# Patient Record
Sex: Male | Born: 1990 | Race: Black or African American | Hispanic: Yes | Smoking: Former smoker
Health system: Southern US, Community
[De-identification: ages and names within clinical notes are randomized; demographics above are authoritative.]

## PROBLEM LIST (undated history)

## (undated) DIAGNOSIS — Z21 Asymptomatic human immunodeficiency virus [HIV] infection status: Secondary | ICD-10-CM

## (undated) DIAGNOSIS — J45909 Unspecified asthma, uncomplicated: Secondary | ICD-10-CM

## (undated) DIAGNOSIS — F319 Bipolar disorder, unspecified: Secondary | ICD-10-CM

## (undated) DIAGNOSIS — F1010 Alcohol abuse, uncomplicated: Secondary | ICD-10-CM

## (undated) DIAGNOSIS — F909 Attention-deficit hyperactivity disorder, unspecified type: Secondary | ICD-10-CM

---
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
## 2009-02-04 ENCOUNTER — Emergency Department: Payer: Self-pay | Admitting: Emergency Medicine

## 2009-07-31 ENCOUNTER — Emergency Department: Payer: Self-pay | Admitting: Unknown Physician Specialty

## 2010-08-02 LAB — COMPREHENSIVE METABOLIC PANEL
AST: 23 U/L (ref 0–37)
Albumin: 3.9 g/dL (ref 3.5–5.2)
Alkaline Phosphatase: 50 U/L (ref 39–117)
BUN: 11 mg/dL (ref 6–23)
Chloride: 106 mEq/L (ref 96–112)
Creatinine, Ser: 1 mg/dL (ref 0.4–1.5)
GFR calc Af Amer: >60 mL/min (ref >60)
Potassium: 3.2 mEq/L — ABNORMAL LOW (ref 3.5–5.1)
Total Bilirubin: 0.6 mg/dL (ref 0.3–1.2)
Total Protein: 6.5 g/dL (ref 6.0–8.3)

## 2010-08-02 LAB — DIFFERENTIAL
Eosinophils Relative: 1 % (ref 0–5)
Lymphocytes Relative: 15 % (ref 12–46)
Monocytes Absolute: 0.5 10*3/uL (ref 0.1–1.0)
Monocytes Relative: 9 % (ref 3–12)
Neutro Abs: 4.4 10*3/uL (ref 1.7–7.7)

## 2010-08-02 LAB — RAPID URINE DRUG SCREEN, HOSP PERFORMED
Amphetamines: NOT DETECTED
Cocaine: NOT DETECTED
Opiates: NOT DETECTED
Tetrahydrocannabinol: NOT DETECTED

## 2010-08-02 LAB — CBC
HCT: 42.1 % (ref 39.0–52.0)
Platelets: 150 10*3/uL (ref 150–400)
RDW: 14 % (ref 11.5–15.5)
WBC: 5.9 10*3/uL (ref 4.0–10.5)

## 2010-08-02 LAB — ETHANOL: Alcohol, Ethyl (B): 173 mg/dL — ABNORMAL HIGH (ref 0–10)

## 2010-08-02 LAB — TRICYCLICS SCREEN, URINE: TCA Scrn: NOT DETECTED

## 2010-08-31 NOTE — Op Note (Signed)
NAMEESTEVAN, KERSH               ACCOUNT NO.:  [id number]   MEDICAL RECORD NO.:  [id number]          PATIENT TYPE:  AMB   LOCATION:  DSC                          FACILITY:  [facility]   PHYSICIAN:  Matthew A. 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   PROCEDURE:  Division of the left index to left long cross finger flap  and manipulation of thumb MP and index finger MP and PIP joints.   SURGEON:  Artist Pais. Mina Marble, M.D.   ASSISTANT:  None.   ANESTHESIA:  General. No tourniquet, no complications, and no drain.   DESCRIPTION OF PROCEDURE:  The patient was taken to operating suite  after induction of adequate general anesthesia, left upper extremity was  prepped in sterile fashion.  Once this was done the previously placed  sutures along the left thumb left index finger cross-finger flap complex  were removed. After this was done, the flap was divided.  The flap was  then inset both onto the thumb index finger using 4-0 chromic. The MP  joint of the thumb and the MP and PIP joint index finger were gently  manipulated under anesthesia to full passive flexion/extension. The  wounds were dressed with Xeroform, 4x4s and Coban wrap.  The patient  tolerated procedures well and went to recovery room in stable condition.      [name], M.D.  Electronically Signed     MAW/MEDQ  D:  09/13/2006  T:  09/13/2006  Job:  [id number]

## 2010-08-31 NOTE — Op Note (Signed)
Daniel [name], Daniel [name]               ACCOUNT NO.:  [id number]   MEDICAL RECORD NO.:  [id number]          PATIENT TYPE:  INP   LOCATION:  5037                         FACILITY:  [facility]   PHYSICIAN:  Artist Pais. Weingold, M.D.DATE OF BIRTH:  Sep 11, 1990   DATE OF PROCEDURE:  08/28/2006  DATE OF DISCHARGE:                               OPERATIVE REPORT   PREOPERATIVE DIAGNOSIS:  Tip amputation nondominant left thumb.   POSTOPERATIVE DIAGNOSIS:  Tip amputation nondominant left thumb.   PROCEDURE:  Irrigation and debridement and left index to left thumb  cross finger flap.   SURGEON:  Dr. Mina Marble   ASSISTANT:  None.   ANESTHESIA:  General.   TOURNIQUET TIME:  1 hour.   COMPLICATIONS:  None.   OPERATIVE REPORT:  The patient was taken to the operating suite.  After  the induction of adequate general anesthesia, the left upper extremity  was prepped in a sterile fashion.  An Esmarch was used to exsanguinate  limb.  Tourniquet was then inflated to 250 mmHg.  At this point in time,  the left thumb was inspected.  There was complete loss of the nail  plate.  The nail matrix was completely gone except the very radial  aspect of the germinal matrix, and there was a comminuted fracture of  the remaining aspect of the distal phalanx.  The wound was thoroughly  irrigated.  The distal phalangeal bone was debrided down to a stable  rim.  At this point in time, a cross finger flap was fashioned from the  index finger on the same hand, and flap was raised with a rearly-based  hinge.  The cross finger flap was then sewn to the volar aspect of the  thumb under no undue tension to cover the soft tissue and osseous  defect.  This was done with 4-0 nylon.  After this is completed, a full-  thickness hemi-graft was taken from the forearm and wrist crease.  This  full-thickness hemi-graft was then sewn into the donor site using 4-0  nylon on the corners and 4-0 chromic, running, around the edges.  The  4-  0 nylon was then used to tie over a well-padded bolster to secure the  graft.  The donor site was then closed with 4-0 Vicryl Rapide suture in  horizontal mattress.  The patient was then placed in a sterile dressing  of Xeroform, 4 x 4's fluffs with the wrist flexed, the thumb and index  finger protected.  Prior to wound closure, a median nerve block was  performed for postoperative pain control.  The patient tolerated the  procedures well.      [name], M.D.  Electronically Signed    MAW/MEDQ  D:  08/28/2006  T:  08/29/2006  Job:  [id number]

## 2010-08-31 NOTE — Consult Note (Signed)
Daniel [name]               ACCOUNT NO.:  [id number]   MEDICAL RECORD NO.:  [id number]          PATIENT TYPE:  INP   LOCATION:  1831                         FACILITY:  [facility]   PHYSICIAN:  Artist Pais. Weingold, M.D.DATE OF BIRTH:  [birth year]   DATE OF CONSULTATION:  DATE OF DISCHARGE:                                 CONSULTATION   PHYSICIAN REQUESTING CONSULTATION:  Dr. Estell Harpin.   REASON FOR CONSULTATION:  Daniel [name] is a 20 year old right-hand  dominant male who is currently at the [facility]  who presents with traumatic injury to his nondominant left thumb with an  amputation of the distal aspect.  He is 15.   ALLERGIES:  HE HAS NO KNOWN DRUG ALLERGIES.   CURRENT MEDICATIONS:  He takes an inhaler on a p.r.n. basis for asthma.   PAST MEDICAL HISTORY:  No other past medical or surgical history of  note.   SOCIAL HISTORY:  He denies excessive alcohol or tobacco use.   FAMILY MEDICAL HISTORY:  Noncontributory according to the patient.   PHYSICAL EXAMINATION:  EXTREMITIES:  Reveals a crush evulsion type  injury to the distal aspect of his nondominant left thumb with loss of  the volar skin from the IP flexion crease distal.  Loss of the nail  plate and a significant avulsion-type injury with ribbon defects.  Ribbon signs of the neurovascular bundles on both sides with significant  evulsion component.  The pad is also quite significantly crushed.   X-rays show no fracture, but exposed distal phalangeal bone.   IMPRESSION:  A 20 year old male with a significant crush evulsion injury  to the nondominant left thumb, which is not replantable.  At this point  in time, we are going to take the patient to the operating room for  irrigation, debridement and possible cross finger flap for his primary  closure.      [name], M.D.  Electronically Signed     MAW/MEDQ  D:  08/28/2006  T:  08/28/2006  Job:  [id number]

## 2010-09-03 NOTE — Discharge Summary (Signed)
NAMECALDER, OBLINGER NO.:  [id number]   MEDICAL RECORD NO.:  [id number]          PATIENT TYPE:  IPS   LOCATION:  0507                          [facility]   PHYSICIAN:  Geoffery Lyons, M.D.      DATE OF BIRTH:  01/05/1991   DATE OF ADMISSION:  05/07/2008  DATE OF DISCHARGE:  05/09/2008                               DISCHARGE SUMMARY   CHIEF COMPLAINT/PRESENT ILLNESS:  This was the first admission to [facility] for this 20 year old male, admitted on an  involuntary basis.  Endorsed that he has been drinking super hard  about every 2 months.  He drank four glasses of heavy liquor and one can  of __________.  The commitment papers endorsed that he ingested wine and  pills in a suicide attempt.  He denies suicidal ideation, but did  endorse blackouts.   PAST PSYCHIATRIC HISTORY:  The first time at [facility].  In the  past, has been on lithium, Seroquel and Ritalin.  Diagnosed with bipolar  and ADHD, but endorsed that medications he felt did not work.   ALCOHOL AND DRUG HISTORY:  Persistent use of alcohol in binging pattern.   MEDICAL HISTORY:  Noncontributory.   MEDICATIONS:  None prescribed.   Physical exam failed to show any acute findings.   LABORATORY WORKUP:  Results not available in the chart.   MENTAL STATUS EXAM:  Reveals an alert cooperative male.  Mood anxious.  Affect anxious.  Does not want to be in the hospital.  Thought process  is logical, coherent and relevant.  There were no active suicidal or  homicidal ideas.  No hallucinations or delusions.  He minimizes his use  of alcohol.  He also minimizes any of his presenting symptomatology.  Endorsed no active suicidal or homicidal ideas.  No evidence of  delusions or hallucinations.  Cognition well-preserved.   ADMITTING DIAGNOSES:  Axis I:  Alcohol abuse, status post acute alcohol  intoxication.  Mood disorder, not otherwise specified.  Attention  deficit hyperactivity  disorder.  Axis II:  No diagnosis.  Axis III:  No diagnosis.  Axis  IV: Moderate.  Axis V:  Global Assessment of Functioning upon admission 35, highest  Global Assessment of Functioning in the last year 60.   COURSE IN THE HOSPITAL:  He was admitted, started individual and group  psychotherapy.  As already stated, he report drinking heavily.  He lives  with his grandmother.  He was recently released from jail for common law  robbery.  He endorsed he is in a gang, but not involved in a any current  gang activity.  He endorsed that his gang his is his family and legal  problems he has encountered are not gang related.  Grandmother was  contacted.  She endorsed that he has refused to take medications for  bipolar.  She said that he would be receptive to take medication if he  was to find something that would be effective without side effects.  He  is presently working to try to get his GED.  He was able  to open up.  He  did some work in terms of issues on his growing up.  He was receptive to  the idea that it would be in his best interest to abstain from drinking.  He was going to consider taking medications, but requested not to be at  this particular time.  On May 09, 2008, he was in full contact with  reality.  No active suicidal or homicidal ideas, no delusions.  Grandmother had no concern about his safety.  He was discharged to  outpatient follow-up.   DISCHARGE DIAGNOSES:  Axis I:  Mood disorder, not otherwise specified,  alcohol abuse, attention deficit hyperactivity disorder.  Axis II:  No diagnosis.  Axis III:  No diagnosis.  Axis IV:  Moderate.  Axis V:  Global Assessment of Functioning on discharge 55-60.   Discharged on no medications.  Follow-up by [facility], Dr. Lang Snow  and [facility].      Geoffery Lyons, M.D.  Electronically Signed     IL/MEDQ  D:  05/22/2008  T:  05/23/2008  Job:  [id number]

## 2014-05-14 ENCOUNTER — Emergency Department (HOSPITAL_COMMUNITY)
Admission: EM | Admit: 2014-05-14 | Discharge: 2014-05-14 | Disposition: A | Discharge status: Home / Self Care | Payer: Self-pay | Attending: Emergency Medicine | Admitting: Emergency Medicine

## 2014-05-14 ENCOUNTER — Encounter (HOSPITAL_COMMUNITY): Payer: Self-pay | Admitting: Emergency Medicine

## 2014-05-14 DIAGNOSIS — R197 Diarrhea, unspecified: Secondary | ICD-10-CM | POA: Insufficient documentation

## 2014-05-14 DIAGNOSIS — R1084 Generalized abdominal pain: Secondary | ICD-10-CM | POA: Insufficient documentation

## 2014-05-14 DIAGNOSIS — R112 Nausea with vomiting, unspecified: Secondary | ICD-10-CM | POA: Insufficient documentation

## 2014-05-14 DIAGNOSIS — Z8659 Personal history of other mental and behavioral disorders: Secondary | ICD-10-CM | POA: Insufficient documentation

## 2014-05-14 HISTORY — DX: Bipolar disorder, unspecified: F31.9

## 2014-05-14 HISTORY — DX: Alcohol abuse, uncomplicated: F10.10

## 2014-05-14 HISTORY — DX: Attention-deficit hyperactivity disorder, unspecified type: F90.9

## 2014-05-14 LAB — COMPREHENSIVE METABOLIC PANEL
ALBUMIN: 4.4 g/dL (ref 3.5–5.2)
ALK PHOS: 37 U/L — AB (ref 39–117)
ALT: 17 U/L (ref 0–53)
ANION GAP: 7 (ref 5–15)
AST: 23 U/L (ref 0–37)
BUN: 11 mg/dL (ref 6–23)
CALCIUM: 9 mg/dL (ref 8.4–10.5)
CO2: 24 mmol/L (ref 19–32)
CREATININE: 0.87 mg/dL (ref 0.50–1.35)
Chloride: 104 mmol/L (ref 96–112)
GFR calc Af Amer: >90 mL/min (ref >90)
GFR calc non Af Amer: >90 mL/min (ref >90)
Glucose, Bld: 115 mg/dL — ABNORMAL HIGH (ref 70–99)
Potassium: 4.2 mmol/L (ref 3.5–5.1)
Sodium: 135 mmol/L (ref 135–145)
TOTAL PROTEIN: 8.1 g/dL (ref 6.0–8.3)
Total Bilirubin: 1.1 mg/dL (ref 0.3–1.2)

## 2014-05-14 LAB — CBC WITH DIFFERENTIAL/PLATELET
BASOS PCT: 0 % (ref 0–1)
Basophils Absolute: 0 10*3/uL (ref 0.0–0.1)
Eosinophils Absolute: 0 10*3/uL (ref 0.0–0.7)
Eosinophils Relative: 0 % (ref 0–5)
HEMATOCRIT: 46 % (ref 39.0–52.0)
Hemoglobin: 14.7 g/dL (ref 13.0–17.0)
LYMPHS ABS: 0.3 10*3/uL — AB (ref 0.7–4.0)
Lymphocytes Relative: 5 % — ABNORMAL LOW (ref 12–46)
MCH: 27.1 pg (ref 26.0–34.0)
MCHC: 32 g/dL (ref 30.0–36.0)
MCV: 84.7 fL (ref 78.0–100.0)
MONO ABS: 0.2 10*3/uL (ref 0.1–1.0)
Monocytes Relative: 3 % (ref 3–12)
NEUTROS PCT: 92 % — AB (ref 43–77)
Neutro Abs: 7 10*3/uL (ref 1.7–7.7)
PLATELETS: 186 10*3/uL (ref 150–400)
RBC: 5.43 MIL/uL (ref 4.22–5.81)
RDW: 13.7 % (ref 11.5–15.5)
WBC: 7.6 10*3/uL (ref 4.0–10.5)

## 2014-05-14 LAB — LIPASE, BLOOD: Lipase: 28 U/L (ref 11–59)

## 2014-05-14 MED ORDER — ONDANSETRON HCL 4 MG PO TABS: 4.0000 mg | ORAL_TABLET | Freq: Three times a day (TID) | ORAL | Status: DC | PRN

## 2014-05-14 MED ORDER — DICYCLOMINE HCL 10 MG/ML IM SOLN
20.0000 mg | Freq: Once | INTRAMUSCULAR | Status: AC
  Administered 2014-05-14: 20 mg via INTRAMUSCULAR
  Filled 2014-05-14: qty 2

## 2014-05-14 MED ORDER — DICYCLOMINE HCL 20 MG PO TABS: 20.0000 mg | ORAL_TABLET | Freq: Four times a day (QID) | ORAL | Status: DC | PRN

## 2014-05-14 MED ORDER — ONDANSETRON 8 MG PO TBDP
8.0000 mg | ORAL_TABLET | Freq: Once | ORAL | Status: AC
  Administered 2014-05-14: 8 mg via ORAL
  Filled 2014-05-14: qty 1

## 2014-05-14 NOTE — ED Notes (Signed)
Pt given ginger ale.

## 2014-05-14 NOTE — Discharge Instructions (Signed)
°Emergency Department Resource Guide °1) Find a Doctor and Pay Out of Pocket °Although you won't have to find out who is covered by your insurance plan, it is a good idea to ask around and get recommendations. You will then need to call the office and see if the doctor you have chosen will accept you as a new patient and what types of options they offer for patients who are self-pay. Some doctors offer discounts or will set up payment plans for their patients who do not have insurance, but you will need to ask so you aren't surprised when you get to your appointment. ° °2) Contact Your Local Health Department °Not all health departments have doctors that can see patients for sick visits, but many do, so it is worth a call to see if yours does. If you don't know where your local health department is, you can check in your phone book. The CDC also has a tool to help you locate your state's health department, and many state websites also have listings of all of their local health departments. ° °3) Find a Walk-in Clinic °If your illness is not likely to be very severe or complicated, you may want to try a walk in clinic. These are popping up all over the country in pharmacies, drugstores, and shopping centers. They're usually staffed by nurse practitioners or physician assistants that have been trained to treat common illnesses and complaints. They're usually fairly quick and inexpensive. However, if you have serious medical issues or chronic medical problems, these are probably not your best option. ° °No Primary Care Doctor: °- Call [facility] Connect at  832-8000 - they can help you locate a primary care doctor that  accepts your insurance, provides certain services, etc. °- Physician Referral Service- 1-800-533-3463 ° °Chronic Pain Problems: °Organization         Address  Phone   Notes  °[facility]  (336) 297-2271 Patients need to be referred by their primary care doctor.  ° °Medication  Assistance: °Organization         Address  Phone   Notes  °[place] [facility] [address] °[place], [place] [address] (336) 641-8030 --Must be a resident of [place] °-- Must have NO insurance coverage whatsoever (no Medicaid/ Medicare, etc.) °-- The pt. MUST have a primary care doctor that directs their care regularly and follows them in the community °  °[facility]  (866) 331-1348   °[employer]  (888) 892-1162   ° °Agencies that provide inexpensive medical care: °Organization         Address  Phone   Notes  °[facility]  (336) 832-8035   °[facility]    (336) 832-7272   °[facility] [address] °[place], [place] [address] (336) 832-4777   °[facility] [address], °[place] (336) 271-4999   °[facility]    (336) 373-0678   °[facility]    (336) 272-1050   °[facility] ° [address], [place] Phone:  (336) 832-4444, Fax:  (336) 832-4440 Hours of Operation:  9 am - 6 pm, M-F.  Also accepts Medicaid/Medicare and self-pay.  °[facility] ° [address], [place] Phone: (336) 832-3150, Fax: (336) 832-3151. Hours of Operation:  8:30 am - 5:30 pm, M-F.  Also accepts Medicaid and self-pay.  °[facility] [place] [address], [place] Phone: (336) 878-6027   °[facility] [address], [place], [place] (336)723-1848, Ext. 123 Mondays & Thursdays: 7-9 AM.  First 15 patients are seen on a first come, first serve basis. °  ° °Medicaid-accepting [place] Providers: ° °Organization         Address  Phone   Notes  °[facility] [address], [place] (336) 641-2100 Also accepts self-pay patients.  °[facility] [address], [place] ° (336) 856-9996   °[facility] [address], [place]  (336) 288-8857   °[facility] [address] [place] [address], [place] (336) 299-7000   °Veita Bland [address], [place]  ° (336) 373-1557 Only accepts [place] Access Medicaid patients after they have their name applied to their card.  ° °Self-Pay (no insurance) in [place]: ° °Organization         Address  Phone   Notes  °Sickle Cell Patients, [facility] [address], [place] (336) 832-1970   °[facility] [address], [place] (336) 832-4400   °[facility] ° [address], [place] (336) 992-4800   °[facility]/Dr. Osei-Bonsu ° [address] [place] [address], [place] or [address], [place] (336) 841-8500 Phone number for both [place] and [place] locations is the same.  °[facility] [address], [place] (336) 299-0000   °[facility] [address] [place] [address], [place] or [address] (336) 852-7530 °(336) 878-2260   °[facility] [address], [place] (336) 350-1642, phone; (336) 294-5005, fax Sees patients 1st and 3rd Saturday of every month.  Must not qualify for public or private insurance (i.e. Medicaid, Medicare, [place] Health Choice, Veterans' Benefits) • Household income should be no more than 200% of the poverty level •The clinic cannot treat you if you are pregnant or think you are pregnant • Sexually transmitted diseases are not treated at the clinic.  ° ° °Dental Care: °Organization         Address  Phone  Notes  °[place] [facility] [address],  (336) 641-6152 Accepts children up to age 21 who are enrolled in Medicaid or [place] Health Choice; pregnant women with a Medicaid card; and children who have applied for Medicaid or [place] Health Choice, but were declined, whose parents can pay a reduced fee at time of service.  °[place]  [facility] [place]  [address], [place] (336) 641-7733 Accepts children up to age 21 who are enrolled in Medicaid or [place] Health Choice; pregnant women with a Medicaid card; and children who have applied for Medicaid or [place] Health Choice, but were declined, whose parents can pay a reduced fee at time of service.  °[facility] ° [address],  (336) 641-4533 Patients are seen by appointment only. Walk-ins are not accepted. [facility] will see patients 18 years of age and older. °Monday - Tuesday (8am-5pm) °Most Wednesdays (8:30-5pm) °$30 per visit, cash only  °[facility] ° [address], [place] (336) 641-4533 Patients are seen by appointment only. Walk-ins are not accepted. [facility] will see patients 18 years of age and older. °One   Wednesday Evening (Monthly: Volunteer Based).  $30 per visit, cash only  °[facility]  (919) 537-3737 for adults; Children under age 4, call Graduate Pediatric Dentistry at (919) [phone]. Children aged 4-14, please call (919) 537-3737 to request a pediatric application. ° Dental services are provided in all areas of dental care including fillings, crowns and bridges, complete and partial dentures, implants, gum treatment, root canals, and extractions. Preventive care is also provided. Treatment is provided to both adults and children. °Patients are selected via a lottery and there is often a waiting list. °  °[facility] [address], °[place] ° (336) 763-8833 [email or web address] °  °[facility] [address], [place], [place] (336)723-1848, Ext. 123 Second and Fourth Thursday of each month, opens at 6:30 AM; Clinic ends at 9 AM.  Patients are seen on a first-come first-served basis, and a limited number are seen during each clinic.  ° °[facility] ° [address], [place], [place] (336) 723-7904    Eligibility Requirements °You must have lived in [place], [place], or [place] counties for at least the last three months. °  You cannot be eligible for state or federal sponsored healthcare insurance, including Veterans Administration, Medicaid, or Medicare. °  You generally cannot be eligible for healthcare insurance through your employer.  °  How to apply: °Eligibility screenings are held every Tuesday and Wednesday afternoon from 1:00 pm until 4:00 pm. You do not need an appointment for the interview!  °[facility] [address], [place], [place] 336-631-2330   °[facility] [facility]  336-342-8273   °[facility]  336-703-3100   °[facility]  336-570-6415   ° °Behavioral Health Resources in the Community: °Intensive Outpatient Programs °Organization         Address  Phone  Notes  °[facility] [address], [place], [place] 336-878-6098   °[facility] [address], [place], [place] 336-832-9800   °[facility] [address], [place], [place] ° 336-882-2125   °[facility] [address],  °[place], [place] 1-800-853-5163 or 336-641-4981   °Substance Abuse Resources °Organization         Address  Phone  Notes  °[facility]  336-882-2125   °[facility]  336-784-9470   °[facility]  336-285-9073   °[facility]  336-845-3988   °Residential & Outpatient Substance Abuse Program  1-800-659-3381   °Psychological Services °Organization         Address  Phone  Notes  °[facility]  336- 832-9600   °[facility]  336- 378-7881   °[facility] [address], [place] 1-800-853-5163 or 336-641-4981   ° °Mobile Crisis Teams °Organization         Address  Phone  Notes  °[facility], Mobile Crisis Care Unit  1-877-626-1772   °Assertive °[facility] ° [address]  [place], [place] 336-834-9664   °Sharon DeEsch [address] ° [place] 336-554-5454   ° °Self-Help/Support Groups °Organization         Address  Phone             Notes  °[facility]  - variety of support groups  336- 373-1402 Call for more information  °Narcotics Anonymous (NA), [facility] [address], °[place]   2 meetings at this location  ° °  Residential Treatment Programs °Organization         Address  Phone  Notes  °[facility] [address],    °[place]  1-866-801-8205   °[facility] ° [address], [place], [place] 704-293-8524   °[facility] [address], [place] 336-845-3988 Admissions: 8am-3pm M-F  °[facility] [address]    °[place], [place] 336-841-1104   °[facility] [address], [place], [place] 336-379-7146   °[facility] [address]  °[place], [place] 336-285-9073   °[facility] Intensive Outpatient [address], [place], [place] 336-852-3033   °[facility] [address]  °[place], [place] 1-877-615-2722 or 336-784-9470   °[facility] [address] [place], [place] 336-227-7417 Accepts Medicaid  °[facility] [address]  °[place] 1-800-659-3381 Substance Abuse/Addiction Treatment  ° °[place] Behavioral Health Resources °Organization         Address  Phone  Notes  °[facility]  (888) 581-9988   °Julie Brannon, PhD [address] [place], [place]   (336) 349-5553 or (336) 951-0000   °[facility]   [address] °[place], [place] (336) 349-4454   °[facility] [address], [place], [place] (336) 342-8316 Insurance/Medicaid/sponsorship through [facility]  °[facility] [address]                                    [place], [place] (336) 342-8316 Therapy/tele-psych/case    °[facility] [address]  ° [place], [place] (336) 349-2233    °Dr. Arfeen  (336) 349-4544   °[facility] [place]  [employer] [place] [facility] 1) [address], [place] °2) [address], [place] °3)  [address], [place] (336) 349-3220 °(336) 342-7768 ° °(336) 342-8140   °[place] [facility] (336) 342-1394 or (336) 342-3537 (After Hours)    ° ° °Take the prescriptions as directed.  Increase your fluid intake (ie:  Gatoraide) for the next few days, as discussed.  Eat a bland diet and advance to your regular diet slowly as you can tolerate it.   Avoid full strength juices, as well as milk and milk products until your diarrhea has resolved.   Call your regular medical doctor today to schedule a follow up appointment this week.  Return to the Emergency Department immediately if not improving (or even worsening) despite taking the medicines as prescribed, any black or bloody stool or vomit, if you develop a fever over "101," or for any other concerns. ° °

## 2014-05-14 NOTE — ED Notes (Addendum)
Per EMS, pt states he woke up this morning with abdominal pain and nausea. Pt states he has vomited 6 times today, had diarrhea 4 times. Pt states his cousin has had similar symptoms for the past 2 days. Pt denies fever.

## 2014-05-14 NOTE — ED Notes (Signed)
Bed: WA07 Expected date:  Expected time:  Means of arrival:  Comments: 24 y/o M abd pain

## 2014-05-14 NOTE — ED Provider Notes (Signed)
CSN: [id number]     Arrival date & time 05/14/14  1053 History   First MD Initiated Contact with Patient 05/14/14 1055     Chief Complaint  Patient presents with  . Abdominal Pain  . Nausea  . Emesis  . Diarrhea      HPI  Pt was seen at 1105.  Per pt, c/o gradual onset and persistence of multiple intermittent episodes of N/V/D that began at 6am this morning. Describes the stools as "watery." Has been associated with generalized abd "cramping." +others in household with same symptoms. Denies CP/SOB, no back pain, no fevers, no black or blood in stools or emesis.    Past Medical History  Diagnosis Date  . Bipolar disorder   . ADHD (attention deficit hyperactivity disorder)   . Alcohol consumption binge drinking    Past Surgical History  Procedure Laterality Date  . 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 Smoking status: Never Smoker   . Smokeless tobacco: Not on file  . Alcohol Use: No    Review of Systems ROS: Statement: All systems negative except as marked or noted in the HPI; Constitutional: Negative for fever and chills. ; ; Eyes: Negative for eye pain, redness and discharge. ; ; ENMT: Negative for ear pain, hoarseness, nasal congestion, sinus pressure and sore throat. ; ; Cardiovascular: Negative for chest pain, palpitations, diaphoresis, dyspnea and peripheral edema. ; ; Respiratory: Negative for cough, wheezing and stridor. ; ; Gastrointestinal: +N/V/D, abd pain. Negative for blood in stool, hematemesis, jaundice and rectal bleeding. . ; ; Genitourinary: Negative for dysuria, flank pain and hematuria. ; ; Musculoskeletal: Negative for back pain and neck pain. Negative for swelling and trauma.; ; Skin: Negative for pruritus, rash, abrasions, blisters, bruising and skin lesion.; ; Neuro: Negative for headache, lightheadedness and neck stiffness. Negative for weakness, altered level of consciousness , altered mental status, extremity weakness,  paresthesias, involuntary movement, seizure and syncope.      Allergies  Review of patient's allergies indicates not on file.  Home Medications   Prior to Admission medications   Not on File   BP 131/68 mmHg  Pulse 74  Temp(Src) 98.3 F (36.8 C) (Oral)  Resp 16  SpO2 100% Physical Exam  1110: Physical examination:  Nursing notes reviewed; Vital signs and O2 SAT reviewed;  Constitutional: Well developed, Well nourished, Well hydrated, In no acute distress; Head:  Normocephalic, atraumatic; Eyes: EOMI, PERRL, No scleral icterus; ENMT: Mouth and pharynx normal, Mucous membranes moist; Neck: Supple, Full range of motion, No lymphadenopathy; Cardiovascular: Regular rate and rhythm, No murmur, rub, or gallop; Respiratory: Breath sounds clear & equal bilaterally, No rales, rhonchi, wheezes.  Speaking full sentences with ease, Normal respiratory effort/excursion; Chest: Nontender, Movement normal; Abdomen: Soft, Nontender, Nondistended, Normal bowel sounds; Genitourinary: No CVA tenderness; Extremities: Pulses normal, No tenderness, No edema, No calf edema or asymmetry.; Neuro: AA&Ox3, Major CN grossly intact.  Speech clear. No gross focal motor or sensory deficits in extremities. Climbs on and off stretcher easily by himself. Gait steady.; Skin: Color normal, Warm, Dry.   ED Course  Procedures     EKG Interpretation None      MDM  MDM Reviewed: previous chart, nursing note and vitals Reviewed previous: labs Interpretation: labs      Results for orders placed or performed during the hospital encounter of 05/14/14  Comprehensive metabolic panel  Result Value Ref Range   Sodium 135 135 -  145 mmol/L   Potassium 4.2 3.5 - 5.1 mmol/L   Chloride 104 96 - 112 mmol/L   CO2 24 19 - 32 mmol/L   Glucose, Bld 115 (H) 70 - 99 mg/dL   BUN 11 6 - 23 mg/dL   Creatinine, Ser 1.61 0.50 - 1.35 mg/dL   Calcium 9.0 8.4 - 09.6 mg/dL   Total Protein 8.1 6.0 - 8.3 g/dL   Albumin 4.4 3.5 - 5.2  g/dL   AST 23 0 - 37 U/L   ALT 17 0 - 53 U/L   Alkaline Phosphatase 37 (L) 39 - 117 U/L   Total Bilirubin 1.1 0.3 - 1.2 mg/dL   GFR calc non Af Amer >90 >90 mL/min   GFR calc Af Amer >90 >90 mL/min   Anion gap 7 5 - 15  Lipase, blood  Result Value Ref Range   Lipase 28 11 - 59 U/L  CBC with Differential  Result Value Ref Range   WBC 7.6 4.0 - 10.5 K/uL   RBC 5.43 4.22 - 5.81 MIL/uL   Hemoglobin 14.7 13.0 - 17.0 g/dL   HCT 04.5 40.9 - 81.1 %   MCV 84.7 78.0 - 100.0 fL   MCH 27.1 26.0 - 34.0 pg   MCHC 32.0 30.0 - 36.0 g/dL   RDW 91.4 78.2 - 95.6 %   Platelets 186 150 - 400 K/uL   Neutrophils Relative % 92 (H) 43 - 77 %   Neutro Abs 7.0 1.7 - 7.7 K/uL   Lymphocytes Relative 5 (L) 12 - 46 %   Lymphs Abs 0.3 (L) 0.7 - 4.0 K/uL   Monocytes Relative 3 3 - 12 %   Monocytes Absolute 0.2 0.1 - 1.0 K/uL   Eosinophils Relative 0 0 - 5 %   Eosinophils Absolute 0.0 0.0 - 0.7 K/uL   Basophils Relative 0 0 - 1 %   Basophils Absolute 0.0 0.0 - 0.1 K/uL    1230:  Pt has tol PO well while in the ED without N/V.  No stooling while in the ED.  Abd remains benign, VSS. Feels better and wants to go home now. Dx and testing d/w pt.  Questions answered.  Verb understanding, agreeable to d/c home with outpt f/u.       Samuel Jester, DO 05/17/14 [phone]

## 2015-03-27 ENCOUNTER — Emergency Department (HOSPITAL_COMMUNITY): Payer: Self-pay

## 2015-03-27 ENCOUNTER — Emergency Department (HOSPITAL_COMMUNITY)
Admission: EM | Admit: 2015-03-27 | Discharge: 2015-03-27 | Disposition: A | Discharge status: Home / Self Care | Payer: Self-pay | Attending: Emergency Medicine | Admitting: Emergency Medicine

## 2015-03-27 ENCOUNTER — Encounter (HOSPITAL_COMMUNITY): Payer: Self-pay | Admitting: Emergency Medicine

## 2015-03-27 DIAGNOSIS — Z79899 Other long term (current) drug therapy: Secondary | ICD-10-CM | POA: Insufficient documentation

## 2015-03-27 DIAGNOSIS — R059 Cough, unspecified: Secondary | ICD-10-CM

## 2015-03-27 DIAGNOSIS — R05 Cough: Secondary | ICD-10-CM

## 2015-03-27 DIAGNOSIS — Z8659 Personal history of other mental and behavioral disorders: Secondary | ICD-10-CM | POA: Insufficient documentation

## 2015-03-27 DIAGNOSIS — J40 Bronchitis, not specified as acute or chronic: Secondary | ICD-10-CM | POA: Insufficient documentation

## 2015-03-27 MED ORDER — HYDROCODONE-HOMATROPINE 5-1.5 MG/5ML PO SYRP: 5.0000 mL | ORAL_SOLUTION | Freq: Four times a day (QID) | ORAL | Status: DC | PRN

## 2015-03-27 MED ORDER — PREDNISONE 20 MG PO TABS
60.0000 mg | ORAL_TABLET | Freq: Once | ORAL | Status: AC
  Administered 2015-03-27: 60 mg via ORAL
  Filled 2015-03-27: qty 3

## 2015-03-27 MED ORDER — HYDROCODONE-HOMATROPINE 5-1.5 MG/5ML PO SYRP
5.0000 mL | ORAL_SOLUTION | Freq: Once | ORAL | Status: AC
  Administered 2015-03-27: 5 mL via ORAL
  Filled 2015-03-27: qty 5

## 2015-03-27 MED ORDER — PREDNISONE 20 MG PO TABS: 40.0000 mg | ORAL_TABLET | Freq: Every day | ORAL | Status: DC

## 2015-03-27 MED ORDER — BENZONATATE 100 MG PO CAPS: 100.0000 mg | ORAL_CAPSULE | Freq: Three times a day (TID) | ORAL | Status: DC | PRN

## 2015-03-27 MED ORDER — ALBUTEROL SULFATE HFA 108 (90 BASE) MCG/ACT IN AERS
2.0000 | INHALATION_SPRAY | Freq: Once | RESPIRATORY_TRACT | Status: AC
  Administered 2015-03-27: 2 via RESPIRATORY_TRACT
  Filled 2015-03-27: qty 6.7

## 2015-03-27 NOTE — ED Provider Notes (Signed)
History  By signing my name below, I, Karle PlumberJennifer Tensley, attest that this documentation has been prepared under the direction and in the presence of TRW AutomotiveKelly Nasirah Sachs, PA-C. Electronically Signed: Karle PlumberJennifer Tensley, ED Scribe. 03/27/2015. 9:53 PM.  Chief Complaint  Patient presents with  . Cough  . Emesis   The history is provided by the patient and medical records. No language interpreter was used.    HPI Comments:  Daniel [name] is a 24 y.o. male brought in by EMS, who presents to the Emergency Department complaining of a nonproductive cough that began two weeks ago. He reports one episode of associated post-tussive emesis with streaks of blood and mucus. He reports associated nasal congestion, sore throat, rhinorrhea and chest wall pain secondary to cough. He reports taking an OTC cough medication with minimal relief of the symptoms. He denies modifying factors. He denies black or tarry stools, otalgia, fever or chills.  Past Medical History  Diagnosis Date  . Bipolar disorder (HCC)   . ADHD (attention deficit hyperactivity disorder)   . Alcohol consumption binge drinking    Past Surgical History  Procedure Laterality Date  . 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 No pertinent family history. Social History  Substance Use Topics  . Smoking status: Never Smoker   . Smokeless tobacco: None  . Alcohol Use: No    Review of Systems  Constitutional: Negative for fever and chills.  HENT: Positive for congestion, rhinorrhea and sore throat.   Respiratory: Positive for cough.   Gastrointestinal: Positive for vomiting (one episode of post-tussive emesis).  Musculoskeletal: Positive for myalgias.  All other systems reviewed and are negative.   Allergies  Shellfish allergy and Iodine  Home Medications   Prior to Admission medications   Medication Sig Start Date End Date Taking? Authorizing Provider  albuterol (PROVENTIL HFA;VENTOLIN HFA) 108 (90 BASE) MCG/ACT inhaler  Inhale 2-4 puffs into the lungs every 6 (six) hours as needed for wheezing or shortness of breath.    Historical Provider, MD  benzonatate (TESSALON) 100 MG capsule Take 1 capsule (100 mg total) by mouth 3 (three) times daily as needed for cough. 03/27/15   Antony MaduraKelly Cordell Coke, PA-C  dicyclomine (BENTYL) 20 MG tablet Take 1 tablet (20 mg total) by mouth every 6 (six) hours as needed for spasms (abdominal cramping). 05/14/14   Samuel JesterKathleen McManus, DO  HYDROcodone-homatropine [facility](HYCODAN) 5-1.5 MG/5ML syrup Take 5 mLs by mouth every 6 (six) hours as needed for cough. 03/27/15   Antony MaduraKelly Marlon Vonruden, PA-C  ondansetron (ZOFRAN) 4 MG tablet Take 1 tablet (4 mg total) by mouth every 8 (eight) hours as needed for nausea or vomiting. 05/14/14   Samuel JesterKathleen McManus, DO  predniSONE (DELTASONE) 20 MG tablet Take 2 tablets (40 mg total) by mouth daily. 03/27/15   Antony MaduraKelly Lulla Linville, PA-C   Triage Vitals: BP 188/65 mmHg  Pulse 93  Temp(Src) 98.3 F (36.8 C) (Oral)  Resp 21  SpO2 95%  Physical Exam  Constitutional: He is oriented to person, place, and time. He appears well-developed and well-nourished. No distress.  Nontoxic/nonseptic appearing  HENT:  Head: Normocephalic and atraumatic.  Eyes: Conjunctivae and EOM are normal. No scleral icterus.  Neck: Normal range of motion.  No stridor  Cardiovascular: Normal rate, regular rhythm and intact distal pulses.   Pulmonary/Chest: Effort normal and breath sounds normal. No respiratory distress. He has no wheezes. He has no rales.  Lungs CTAB  Musculoskeletal: Normal range of motion.  Neurological: He is alert and  oriented to person, place, and time. He exhibits normal muscle tone. Coordination normal.  GCS 15. Speech is goal oriented. Patient moving all extremities.  Skin: Skin is warm and dry. No rash noted. He is not diaphoretic. No erythema. No pallor.  Psychiatric: He has a normal mood and affect. His behavior is normal.  Nursing note and vitals reviewed.   ED Course  Procedures  (including critical care time) DIAGNOSTIC STUDIES: Oxygen Saturation is 95% on RA, adequate by my interpretation.   COORDINATION OF CARE: 9:30 PM- Will order CXR and a dose of Hycodan prior to imaging. Pt verbalizes understanding and agrees to plan.  Medications  albuterol (PROVENTIL HFA;VENTOLIN HFA) 108 (90 BASE) MCG/ACT inhaler 2 puff (not administered)  HYDROcodone-homatropine (HYCODAN) 5-1.5 MG/5ML syrup 5 mL (5 mLs Oral Given 03/27/15 2146)  predniSONE (DELTASONE) tablet 60 mg (60 mg Oral Given 03/27/15 2146)    Labs Review Labs Reviewed - No data to display  Imaging Review Dg Chest 2 View  03/27/2015  CLINICAL DATA:  Cough for 2 weeks.  Hemoptysis. EXAM: CHEST  2 VIEW COMPARISON:  March 22, 2006. FINDINGS: The heart size and mediastinal contours are within normal limits. Both lungs are clear. No pneumothorax or pleural effusion is noted. The visualized skeletal structures are unremarkable. IMPRESSION: No active cardiopulmonary disease. Electronically Signed   By: Lupita Raider, M.D.   On: 03/27/2015 21:42   I have personally reviewed and evaluated these images and lab results as part of my medical decision-making.   EKG Interpretation None      MDM   Final diagnoses:  Bronchitis  Cough    Pt CXR negative for acute infiltrate. Patients symptoms are consistent with URI/bronchitis, likely viral etiology. Discussed that antibiotics are not indicated for viral infections. Pt will be discharged with symptomatic treatment. Heerbalizes understanding and is agreeable with plan. Pt is hemodynamically stable and in NAD prior to discharge. He was offered an Pharmacist, community ride, by Conservation officer, nature, as he appeared stranded in the ED, but declined help multiple times. States he wants to "walk home and cool off". Patient told to notify nursing staff if he changed his mind. He verbalized understanding to this effect.  I personally performed the services described in this documentation, which was  scribed in my presence. The recorded information has been reviewed and is accurate.    Filed Vitals:   03/27/15 2114  BP: 188/65  Pulse: 93  Temp: 98.3 F (36.8 C)  TempSrc: Oral  Resp: 21  SpO2: 95%      Antony Madura, PA-C 03/27/15 2201  Marily Memos, MD 03/27/15 2237

## 2015-03-27 NOTE — ED Notes (Signed)
Pt was offered multiple free ways to get home, pt declined all of them. After arguing with his friend over the phone about them not picking him up, he decided to walk home. Was offered for us to call a cab, given a bus pass, we then offered to pay for an uber driver, until I finally offered him a free ride home. All of which patient refused. Patient is currently walking home.

## 2015-03-27 NOTE — Discharge Instructions (Signed)
Use an albuterol inhaler, 2 puffs every 4-6 hours, as needed for cough and shortness of breath. Use nasal saline spray for congestion which can be found over-the-counter your local pharmacy. You may take Tessalon for cough and Hycodan as needed for persisting cough. Take prednisone for pain and cough. Follow-up with your doctor for recheck of symptoms.  Cough, Adult Coughing is a reflex that clears your throat and your airways. Coughing helps to heal and protect your lungs. It is normal to cough occasionally, but a cough that happens with other symptoms or lasts a long time may be a sign of a condition that needs treatment. A cough may last only 2-3 weeks (acute), or it may last longer than 8 weeks (chronic). CAUSES Coughing is commonly caused by:  Breathing in substances that irritate your lungs.  A viral or bacterial respiratory infection.  Allergies.  Asthma.  Postnasal drip.  Smoking.  Acid backing up from the stomach into the esophagus (gastroesophageal reflux).  Certain medicines.  Chronic lung problems, including COPD (or rarely, lung cancer).  Other medical conditions such as heart failure. HOME CARE INSTRUCTIONS  Pay attention to any changes in your symptoms. Take these actions to help with your discomfort:  Take medicines only as told by your health care provider.  If you were prescribed an antibiotic medicine, take it as told by your health care provider. Do not stop taking the antibiotic even if you start to feel better.  Talk with your health care provider before you take a cough suppressant medicine.  Drink enough fluid to keep your urine clear or pale yellow.  If the air is dry, use a cold steam vaporizer or humidifier in your bedroom or your home to help loosen secretions.  Avoid anything that causes you to cough at work or at home.  If your cough is worse at night, try sleeping in a semi-upright position.  Avoid cigarette smoke. If you smoke, quit smoking.  If you need help quitting, ask your health care provider.  Avoid caffeine.  Avoid alcohol.  Rest as needed. SEEK MEDICAL CARE IF:   You have new symptoms.  You cough up pus.  Your cough does not get better after 2-3 weeks, or your cough gets worse.  You cannot control your cough with suppressant medicines and you are losing sleep.  You develop pain that is getting worse or pain that is not controlled with pain medicines.  You have a fever.  You have unexplained weight loss.  You have night sweats. SEEK IMMEDIATE MEDICAL CARE IF:  You cough up blood.  You have difficulty breathing.  Your heartbeat is very fast.   This information is not intended to replace advice given to you by your health care provider. Make sure you discuss any questions you have with your health care provider.   Document Released: 10/01/2010 Document Revised: 12/24/2014 Document Reviewed: 06/11/2014 Elsevier Interactive Patient Education [employer]2016 [employer]vier Inc.

## 2015-03-27 NOTE — ED Notes (Signed)
Per [facility], pt has been coughing x 3 days and noticed blood, wanted to be evaluated.

## 2015-03-27 NOTE — ED Notes (Signed)
Complaining of cough x 2 weeks. Dry, lung sounds clear. Today was coughing so hard he vomited and noticed some streaks of blood in vomit.

## 2015-03-27 NOTE — ED Notes (Signed)
Bed: [id number] Expected date:  Expected time:  Means of arrival:  Comments: EMS 24 yo cough x 3 days

## 2016-07-29 ENCOUNTER — Emergency Department (HOSPITAL_COMMUNITY)
Admission: EM | Admit: 2016-07-29 | Discharge: 2016-07-30 | Disposition: A | Discharge status: Other Acute Inpatient Hospital | Payer: No Typology Code available for payment source | Attending: Emergency Medicine | Admitting: Emergency Medicine

## 2016-07-29 ENCOUNTER — Encounter (HOSPITAL_COMMUNITY): Payer: Self-pay | Admitting: Emergency Medicine

## 2016-07-29 DIAGNOSIS — T39392A Poisoning by other nonsteroidal anti-inflammatory drugs [NSAID], intentional self-harm, initial encounter: Secondary | ICD-10-CM | POA: Insufficient documentation

## 2016-07-29 DIAGNOSIS — F3130 Bipolar disorder, current episode depressed, mild or moderate severity, unspecified: Secondary | ICD-10-CM

## 2016-07-29 DIAGNOSIS — R45851 Suicidal ideations: Secondary | ICD-10-CM

## 2016-07-29 DIAGNOSIS — T50902A Poisoning by unspecified drugs, medicaments and biological substances, intentional self-harm, initial encounter: Secondary | ICD-10-CM

## 2016-07-29 DIAGNOSIS — F909 Attention-deficit hyperactivity disorder, unspecified type: Secondary | ICD-10-CM | POA: Insufficient documentation

## 2016-07-29 LAB — CBC
HCT: 43.8 % (ref 39.0–52.0)
Hemoglobin: 14.2 g/dL (ref 13.0–17.0)
MCH: 27.3 pg (ref 26.0–34.0)
MCHC: 32.4 g/dL (ref 30.0–36.0)
MCV: 84.2 fL (ref 78.0–100.0)
PLATELETS: 203 10*3/uL (ref 150–400)
RBC: 5.2 MIL/uL (ref 4.22–5.81)
RDW: 13.4 % (ref 11.5–15.5)
WBC: 5.3 10*3/uL (ref 4.0–10.5)

## 2016-07-29 LAB — COMPREHENSIVE METABOLIC PANEL
ALT: 13 U/L — AB (ref 17–63)
AST: 18 U/L (ref 15–41)
Albumin: 4.6 g/dL (ref 3.5–5.0)
Alkaline Phosphatase: 35 U/L — ABNORMAL LOW (ref 38–126)
Anion gap: 9 (ref 5–15)
BILIRUBIN TOTAL: 0.6 mg/dL (ref 0.3–1.2)
BUN: 11 mg/dL (ref 6–20)
CALCIUM: 9.5 mg/dL (ref 8.9–10.3)
CO2: 27 mmol/L (ref 22–32)
CREATININE: 1.16 mg/dL (ref 0.61–1.24)
Chloride: 103 mmol/L (ref 101–111)
GFR calc Af Amer: >60 mL/min (ref >60)
GFR calc non Af Amer: >60 mL/min (ref >60)
Glucose, Bld: 104 mg/dL — ABNORMAL HIGH (ref 65–99)
Potassium: 3.9 mmol/L (ref 3.5–5.1)
Sodium: 139 mmol/L (ref 135–145)
TOTAL PROTEIN: 7.7 g/dL (ref 6.5–8.1)

## 2016-07-29 LAB — RAPID URINE DRUG SCREEN, HOSP PERFORMED
Amphetamines: NOT DETECTED
Barbiturates: NOT DETECTED
Benzodiazepines: NOT DETECTED
Cocaine: NOT DETECTED
Opiates: NOT DETECTED
Tetrahydrocannabinol: NOT DETECTED

## 2016-07-29 LAB — SALICYLATE LEVEL: Salicylate Lvl: <7 mg/dL (ref 2.8–30.0)

## 2016-07-29 LAB — ACETAMINOPHEN LEVEL: Acetaminophen (Tylenol), Serum: <10 ug/mL — ABNORMAL LOW (ref 10–30)

## 2016-07-29 LAB — ETHANOL

## 2016-07-29 MED ORDER — ZIPRASIDONE MESYLATE 20 MG IM SOLR
20.0000 mg | Freq: Once | INTRAMUSCULAR | Status: AC
  Administered 2016-07-30: 20 mg via INTRAMUSCULAR
  Filled 2016-07-29: qty 20

## 2016-07-29 MED ORDER — DIPHENHYDRAMINE HCL 50 MG/ML IJ SOLN
25.0000 mg | Freq: Once | INTRAMUSCULAR | Status: AC
  Administered 2016-07-30: 25 mg via INTRAMUSCULAR
  Filled 2016-07-29: qty 1

## 2016-07-29 MED ORDER — GI COCKTAIL ~~LOC~~
30.0000 mL | Freq: Once | ORAL | Status: AC
  Administered 2016-07-29: 30 mL via ORAL
  Filled 2016-07-29: qty 30

## 2016-07-29 MED ORDER — LORAZEPAM 2 MG/ML IJ SOLN
1.0000 mg | Freq: Once | INTRAMUSCULAR | Status: AC
  Administered 2016-07-30: 1 mg via INTRAMUSCULAR
  Filled 2016-07-29: qty 1
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
## 2016-07-29 NOTE — ED Notes (Signed)
Pt. Transferred to [facility] from ED to room 35 after screening for contraband. Report to include Situation, Background, Assessment and Recommendations from [facility]. Pt. Oriented to unit including Q15 minute rounds as well as the security cameras for their protection. Patient is alert and oriented, warm and dry in no acute distress. Patient denies SI, HI, and AVH. Pt. Encouraged to let me know if needs arise.

## 2016-07-29 NOTE — ED Provider Notes (Signed)
[facility]-EMERGENCY DEPT Provider Note   CSN: [id number] Arrival date & time: 07/29/16  1351     History   Chief Complaint Chief Complaint  Patient presents with  . Ingestion  . Suicidal    HPI Daniel [name] is a 26 y.o. male.  HPI   26 yo M with PMHx of bipolar disorder, alcohol use here with SI. Pt very agitated about being in ER, limiting history. He states he got into an argument today and wanted "to end it all." He took approx half of a small bottle of ibuprofen. He then experienced stomach upset so he called EMS. He states he wanted to kill himself but then adamantly states that he "didn't do it, so let me go." Denies h/o SI. Denies h/o psych disorders but has h/o bipolar "just as a kid." No current medical complaints. No chest pain. No syncope. No vomiting or BRBPR or melena.  Past Medical History:  Diagnosis Date  . ADHD (attention deficit hyperactivity disorder)   . Alcohol consumption binge drinking   . Bipolar disorder (H[facility])     There are no active problems to display for this patient.   Past Surgical History:  Procedure Laterality Date  . 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 Authorizing Provider  albuterol (PROVENTIL HFA;VENTOLIN HFA) 108 (90 BASE) MCG/ACT inhaler Inhale 1-2 puffs into the lungs every 6 (six) hours as needed for wheezing or shortness of breath.    Yes Historical Provider, MD  ibuprofen (ADVIL,MOTRIN) 200 MG tablet Take 800 mg by mouth every 6 (six) hours as needed for fever, headache, mild pain or moderate pain.   Yes Historical Provider, MD    Family History No family history on file.  Social History Social History  Substance Use Topics  . Smoking status: Never Smoker  . Smokeless tobacco: Not on file  . Alcohol use No     Allergies   Iodine and Shellfish allergy   Review of Systems Review of Systems  Constitutional: Positive for  fatigue. Negative for chills and fever.  HENT: Negative for congestion and rhinorrhea.   Eyes: Negative for visual disturbance.  Respiratory: Negative for cough, shortness of breath and wheezing.   Cardiovascular: Negative for chest pain and leg swelling.  Gastrointestinal: Positive for abdominal pain and nausea. Negative for diarrhea and vomiting.  Genitourinary: Negative for dysuria and flank pain.  Musculoskeletal: Negative for neck pain and neck stiffness.  Skin: Negative for rash and wound.  Allergic/Immunologic: Negative for immunocompromised state.  Neurological: Negative for syncope, weakness and headaches.  Psychiatric/Behavioral: Positive for dysphoric mood and suicidal ideas.  All other systems reviewed and are negative.    Physical Exam Updated Vital Signs BP 133/82 (BP Location: Right Arm)   Pulse 65   Temp 99 F (37.2 C) (Oral)   Resp 18   Ht  (1.702 m)   Wt 175 lb (79.4 kg)   SpO2 96%   BMI 27.41 kg/m   Physical Exam  Constitutional: He is oriented to person, place, and time. He appears well-developed and well-nourished. No distress.  HENT:  Head: Normocephalic and atraumatic.  Eyes: Conjunctivae are normal.  Neck: Neck supple.  Cardiovascular: Normal rate, regular rhythm and normal heart sounds.  Exam reveals no friction rub.   No murmur heard. Pulmonary/Chest: Effort normal and breath sounds normal. No respiratory distress. He has no  wheezes. He has no rales.  Abdominal: He exhibits no distension. There is tenderness (mild, epigastric). There is no rebound and no guarding.  Musculoskeletal: He exhibits no edema.  Neurological: He is alert and oriented to person, place, and time. He exhibits normal muscle tone.  Skin: Skin is warm. Capillary refill takes less than 2 seconds.  Psychiatric: He has a normal mood and affect. He expresses suicidal ideation.  Nursing note and vitals reviewed.    ED Treatments / Results  Labs (all labs ordered are  listed, but only abnormal results are displayed) Labs Reviewed  COMPREHENSIVE METABOLIC PANEL - Abnormal; Notable for the following:       Result Value   Glucose, Bld 104 (*)    ALT 13 (*)    Alkaline Phosphatase 35 (*)    All other components within normal limits  ACETAMINOPHEN LEVEL - Abnormal; Notable for the following:    Acetaminophen (Tylenol), Serum <10 (*)    All other components within normal limits  ETHANOL  SALICYLATE LEVEL  CBC  RAPID URINE DRUG SCREEN, HOSP PERFORMED    EKG  EKG Interpretation  Date/Time:  Friday July 29 2016 14:26:46 EDT Ventricular Rate:  62 PR Interval:    QRS Duration: 101 QT Interval:  364 QTC Calculation: 370 R Axis:   80 Text Interpretation:  Sinus rhythm EKG WITHIN NORMAL LIMITS Confirmed by Erma Heritage MD, Sheria Lang [phone]) on 07/29/2016 4:18:51 PM       Radiology No results found.  Procedures Procedures (including critical care time)  Medications Ordered in ED Medications  gi cocktail (Maalox,Lidocaine,Donnatal) (not administered)     Initial Impression / Assessment and Plan / ED Course  I have reviewed the triage vital signs and the nursing notes.  Pertinent labs & imaging results that were available during my care of the patient were reviewed by me and considered in my medical decision making (see chart for details).    26 yo M with bipolar disorder here with suicide attempt via ibuprofen ingestion. Here voluntarily but reluctant to discuss psych issues. D/w poison control - labs unremarkable, plan to monitor x 6 hours (7 PM), clear at that time if sx unchanged, no vomiting, no other concerning sx. Will need TTS consult once medically cleared.  Final Clinical Impressions(s) / ED Diagnoses   Final diagnoses:  Suicidal ideation  Intentional drug overdose, initial encounter [facility])    New Prescriptions New Prescriptions   No medications on file     Shaune Pollack, MD 07/29/16 [phone]

## 2016-07-29 NOTE — ED Notes (Signed)
Bed: [facility] Expected date:  Expected time:  Means of arrival:  Comments: [place]

## 2016-07-29 NOTE — ED Triage Notes (Signed)
Beth with Poison Control called at 1340 to alert staff EMS is transporting pt to hospital due to ingestion of approx 50   Ibuprofen around 1300. She suggested  tox labs, EKG, hydrate, monitor 6 hours or till asymptomatic.

## 2016-07-29 NOTE — ED Notes (Signed)
Bed: XL24 Expected date:  Expected time:  Means of arrival:  Comments: Hold for [employer]

## 2016-07-29 NOTE — ED Triage Notes (Addendum)
Per EMS, patient from home, reports intentionally taking "half a bottle" of ibuprofen at 1300. C/o increased depression recently and generalized abdominal pain. Reports SI but denies HI/A/V/H. Reports recent breakup with girlfriend, homelessness, and financial problems. Denies N/V/D.

## 2016-07-29 NOTE — ED Notes (Signed)
Bed: ZOX09 Expected date:  Expected time:  Means of arrival:  Comments: [place]

## 2016-07-30 ENCOUNTER — Inpatient Hospital Stay (HOSPITAL_COMMUNITY)
Admission: AD | Admit: 2016-07-30 | Discharge: 2016-08-04 | DRG: 885 | Disposition: A | Discharge status: Home / Self Care | Payer: No Typology Code available for payment source | Attending: Psychiatry | Admitting: Psychiatry

## 2016-07-30 ENCOUNTER — Encounter (HOSPITAL_COMMUNITY): Payer: Self-pay | Admitting: *Deleted

## 2016-07-30 DIAGNOSIS — Z23 Encounter for immunization: Secondary | ICD-10-CM

## 2016-07-30 DIAGNOSIS — F909 Attention-deficit hyperactivity disorder, unspecified type: Secondary | ICD-10-CM | POA: Diagnosis present

## 2016-07-30 DIAGNOSIS — J45909 Unspecified asthma, uncomplicated: Secondary | ICD-10-CM | POA: Diagnosis present

## 2016-07-30 DIAGNOSIS — Z639 Problem related to primary support group, unspecified: Secondary | ICD-10-CM

## 2016-07-30 DIAGNOSIS — F3132 Bipolar disorder, current episode depressed, moderate: Secondary | ICD-10-CM | POA: Diagnosis not present

## 2016-07-30 DIAGNOSIS — Z818 Family history of other mental and behavioral disorders: Secondary | ICD-10-CM | POA: Diagnosis not present

## 2016-07-30 DIAGNOSIS — Y92019 Unspecified place in single-family (private) house as the place of occurrence of the external cause: Secondary | ICD-10-CM | POA: Diagnosis not present

## 2016-07-30 DIAGNOSIS — Z79899 Other long term (current) drug therapy: Secondary | ICD-10-CM | POA: Diagnosis not present

## 2016-07-30 DIAGNOSIS — Z89019 Acquired absence of unspecified thumb: Secondary | ICD-10-CM

## 2016-07-30 DIAGNOSIS — F3130 Bipolar disorder, current episode depressed, mild or moderate severity, unspecified: Secondary | ICD-10-CM | POA: Diagnosis not present

## 2016-07-30 DIAGNOSIS — F329 Major depressive disorder, single episode, unspecified: Secondary | ICD-10-CM

## 2016-07-30 DIAGNOSIS — Z803 Family history of malignant neoplasm of breast: Secondary | ICD-10-CM | POA: Diagnosis not present

## 2016-07-30 DIAGNOSIS — F332 Major depressive disorder, recurrent severe without psychotic features: Principal | ICD-10-CM | POA: Diagnosis present

## 2016-07-30 DIAGNOSIS — Z91048 Other nonmedicinal substance allergy status: Secondary | ICD-10-CM

## 2016-07-30 DIAGNOSIS — T39312A Poisoning by propionic acid derivatives, intentional self-harm, initial encounter: Secondary | ICD-10-CM | POA: Diagnosis present

## 2016-07-30 DIAGNOSIS — Z91013 Allergy to seafood: Secondary | ICD-10-CM

## 2016-07-30 DIAGNOSIS — F4320 Adjustment disorder, unspecified: Secondary | ICD-10-CM | POA: Diagnosis present

## 2016-07-30 DIAGNOSIS — F1099 Alcohol use, unspecified with unspecified alcohol-induced disorder: Secondary | ICD-10-CM

## 2016-07-30 DIAGNOSIS — G4700 Insomnia, unspecified: Secondary | ICD-10-CM | POA: Diagnosis present

## 2016-07-30 DIAGNOSIS — F1290 Cannabis use, unspecified, uncomplicated: Secondary | ICD-10-CM

## 2016-07-30 DIAGNOSIS — F1994 Other psychoactive substance use, unspecified with psychoactive substance-induced mood disorder: Secondary | ICD-10-CM | POA: Diagnosis not present

## 2016-07-30 DIAGNOSIS — Z814 Family history of other substance abuse and dependence: Secondary | ICD-10-CM

## 2016-07-30 DIAGNOSIS — F419 Anxiety disorder, unspecified: Secondary | ICD-10-CM | POA: Diagnosis present

## 2016-07-30 DIAGNOSIS — F1990 Other psychoactive substance use, unspecified, uncomplicated: Secondary | ICD-10-CM

## 2016-07-30 DIAGNOSIS — F319 Bipolar disorder, unspecified: Secondary | ICD-10-CM | POA: Diagnosis present

## 2016-07-30 HISTORY — DX: Unspecified asthma, uncomplicated: J45.909

## 2016-07-30 MED ORDER — STERILE WATER FOR INJECTION IJ SOLN
INTRAMUSCULAR | Status: AC
  Administered 2016-07-30: 1 mL
  Filled 2016-07-30: qty 10

## 2016-07-30 MED ORDER — MAGNESIUM HYDROXIDE 400 MG/5ML PO SUSP: 30.0000 mL | Freq: Every day | ORAL | Status: DC | PRN

## 2016-07-30 MED ORDER — ALBUTEROL SULFATE HFA 108 (90 BASE) MCG/ACT IN AERS: 1.0000 | INHALATION_SPRAY | Freq: Four times a day (QID) | RESPIRATORY_TRACT | Status: DC | PRN

## 2016-07-30 MED ORDER — HYDROXYZINE HCL 25 MG PO TABS
25.0000 mg | ORAL_TABLET | Freq: Three times a day (TID) | ORAL | Status: DC | PRN
  Administered 2016-07-31 – 2016-08-03 (×5): 25 mg via ORAL
  Filled 2016-07-30 (×5): qty 1
  Filled 2016-07-30: qty 10

## 2016-07-30 MED ORDER — ACETAMINOPHEN 325 MG PO TABS
650.0000 mg | ORAL_TABLET | Freq: Four times a day (QID) | ORAL | Status: DC | PRN
  Administered 2016-08-03: 650 mg via ORAL
  Filled 2016-07-30: qty 2

## 2016-07-30 MED ORDER — ALUM & MAG HYDROXIDE-SIMETH 200-200-20 MG/5ML PO SUSP: 30.0000 mL | ORAL | Status: DC | PRN

## 2016-07-30 MED ORDER — TRAZODONE HCL 50 MG PO TABS: 50.0000 mg | ORAL_TABLET | Freq: Every evening | ORAL | Status: DC | PRN

## 2016-07-30 NOTE — ED Notes (Signed)
Hourly rounding reveals patient sleeping in room. No complaints, stable, in no acute distress. Q15 minute rounds and monitoring via Security Cameras to continue. 

## 2016-07-30 NOTE — ED Notes (Signed)
Patient has been quiet and cooperative all shift.  He does minimize his overdose from yesterday.  Patient will be transferred to [facility] later this evening.  He is aware he is going.  He will be transported by [employer].

## 2016-07-30 NOTE — ED Notes (Signed)
Hourly rounding reveals patient in room. No complaints, stable, in no acute distress. Q15 minute rounds and monitoring via Security Cameras to continue. 

## 2016-07-30 NOTE — Consult Note (Signed)
[facility] Psych ED Progress Note  [name] 1:44 PM Daniel [name]  MRN:  [id number] Subjective:   Daniel [name] is a 26 year old male with bipolar disorder, alcohol use disorder, who presented to ED after taking half of a small bottle of ibuprofen.   He states that he was very stressed lately as he ha been unable to see his son for the past few months, who lives with his mother. He also reports discordance with his girlfriend/ex-fiance. He was walking around the house, and took a bottle of ibuprofen (unknown amount, but per chart review, around 30-50 tablets of 259m ibuprofen). Although he denies this as suicide attempt, he admits he was very overwhelmed and did it impulsively.   He reports insomnia. He feels depressed. He denies SI, HI, AH/VH. He reports anxiety. He denies decreased need for sleep or euphoria. He is unemployed. He denies gun access. He uses marijuana regularly; last use about a month ago. He drinks 2-3 shots of liquor occasionally.   Utox negative, EtOH<5  Principal Problem: Bipolar disorder current episode depressed ([facility] [name] Diagnosis:   Patient Active Problem List   Diagnosis Date Noted  . Bipolar disorder current episode depressed ([facility] [F31.30] 07/30/2016   Total Time spent with patient: 30 minutes  Past Psychiatric History:  [facility] He denies [facility] follow up. He denies previous suicide attempt.  Past Medical History:  Past Medical History:  Diagnosis Date  . ADHD (attention deficit hyperactivity disorder)   . Alcohol consumption binge drinking   . Bipolar disorder ([facility]     Past Surgical History:  Procedure Laterality Date  . 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 Family Psychiatric  History: denies Social History:  History  Alcohol Use No     History  Drug use: Unknown    Social History   Social History  . Marital status: Single    Spouse name: N/A  . Number of  children: N/A  . Years of education: N/A   Social History Main Topics  . Smoking status: Never Smoker  . Smokeless tobacco: None  . Alcohol use No  . Drug use: Unknown  . Sexual activity: Not Asked   Other Topics Concern  . None   Social History Narrative  . None    Sleep: Poor  Appetite:  Fair  Current Medications: No current facility-administered medications for this encounter.    Current Outpatient Prescriptions  Medication Sig Dispense Refill  . albuterol (PROVENTIL HFA;VENTOLIN HFA) 108 (90 BASE) MCG/ACT inhaler Inhale 1-2 puffs into the lungs every 6 (six) hours as needed for wheezing or shortness of breath.     .Marland Kitchenibuprofen (ADVIL,MOTRIN) 200 MG tablet Take 800 mg by mouth every 6 (six) hours as needed for fever, headache, mild pain or moderate pain.      Lab Results:  Results for orders placed or performed during the hospital encounter of 07/29/16 (from the past 48 hour(s))  Comprehensive metabolic panel     Status: Abnormal   Collection Time: 07/29/16  2:31 PM  Result Value Ref Range   Sodium 139 135 - 145 mmol/L   Potassium 3.9 3.5 - 5.1 mmol/L   Chloride 103 101 - 111 mmol/L   CO2 27 22 - 32 mmol/L   Glucose, Bld 104 (H) 65 - 99 mg/dL   BUN 11 6 - 20 mg/dL   Creatinine, Ser 1.16 0.61 - 1.24 mg/dL  Calcium 9.5 8.9 - 10.3 mg/dL   Total Protein 7.7 6.5 - 8.1 g/dL   Albumin 4.6 3.5 - 5.0 g/dL   AST 18 15 - 41 U/L   ALT 13 (L) 17 - 63 U/L   Alkaline Phosphatase 35 (L) 38 - 126 U/L   Total Bilirubin 0.6 0.3 - 1.2 mg/dL   GFR calc non Af Amer >60 >60 mL/min   GFR calc Af Amer >60 >60 mL/min    Comment: (NOTE) The eGFR has been calculated using the CKD EPI equation. This calculation has not been validated in all clinical situations. eGFR's persistently <60 mL/min signify possible Chronic Kidney Disease.    Anion gap 9 5 - 15  Ethanol     Status: None   Collection Time: 07/29/16  2:31 PM  Result Value Ref Range   Alcohol, Ethyl (B) <5 <5 mg/dL     Comment:        LOWEST DETECTABLE LIMIT FOR SERUM ALCOHOL IS 5 mg/dL FOR MEDICAL PURPOSES ONLY   Salicylate level     Status: None   Collection Time: 07/29/16  2:31 PM  Result Value Ref Range   Salicylate Lvl <9.0 2.8 - 30.0 mg/dL  Acetaminophen level     Status: Abnormal   Collection Time: 07/29/16  2:31 PM  Result Value Ref Range   Acetaminophen (Tylenol), Serum <10 (L) 10 - 30 ug/mL    Comment:        THERAPEUTIC CONCENTRATIONS VARY SIGNIFICANTLY. A RANGE OF 10-30 ug/mL MAY BE AN EFFECTIVE CONCENTRATION FOR MANY PATIENTS. HOWEVER, SOME ARE BEST TREATED AT CONCENTRATIONS OUTSIDE THIS RANGE. ACETAMINOPHEN CONCENTRATIONS >150 ug/mL AT 4 HOURS AFTER INGESTION AND >50 ug/mL AT 12 HOURS AFTER INGESTION ARE OFTEN ASSOCIATED WITH TOXIC REACTIONS.   cbc     Status: None   Collection Time: 07/29/16  2:31 PM  Result Value Ref Range   WBC 5.3 4.0 - 10.5 K/uL   RBC 5.20 4.22 - 5.81 MIL/uL   Hemoglobin 14.2 13.0 - 17.0 g/dL   HCT 43.8 39.0 - 52.0 %   MCV 84.2 78.0 - 100.0 fL   MCH 27.3 26.0 - 34.0 pg   MCHC 32.4 30.0 - 36.0 g/dL   RDW 13.4 11.5 - 15.5 %   Platelets 203 150 - 400 K/uL  Rapid urine drug screen (hospital performed)     Status: None   Collection Time: 07/29/16  6:21 PM  Result Value Ref Range   Opiates NONE DETECTED NONE DETECTED   Cocaine NONE DETECTED NONE DETECTED   Benzodiazepines NONE DETECTED NONE DETECTED   Amphetamines NONE DETECTED NONE DETECTED   Tetrahydrocannabinol NONE DETECTED NONE DETECTED   Barbiturates NONE DETECTED NONE DETECTED    Comment:        DRUG SCREEN FOR MEDICAL PURPOSES ONLY.  IF CONFIRMATION IS NEEDED FOR ANY PURPOSE, NOTIFY LAB WITHIN 5 DAYS.        LOWEST DETECTABLE LIMITS FOR URINE DRUG SCREEN Drug Class       Cutoff (ng/mL) Amphetamine      1000 Barbiturate      200 Benzodiazepine   211 Tricyclics       155 Opiates          300 Cocaine          300 THC              50     Blood Alcohol level:  Lab Results   Component Value Date   ETH <5 07/29/2016  ETH (H) 05/07/2008    173        LOWEST DETECTABLE LIMIT FOR SERUM ALCOHOL IS 5 mg/dL FOR MEDICAL PURPOSES ONLY    Physical Findings: AIMS:  , ,  ,  ,    CIWA:    COWS:     Musculoskeletal: Strength & Muscle Tone: within normal limits Gait & Station: normal Patient leans: N/A  Psychiatric Specialty Exam: Physical Exam  Review of Systems  Psychiatric/Behavioral: Positive for depression and substance abuse. Negative for hallucinations and suicidal ideas. The patient is nervous/anxious and has insomnia.   All other systems reviewed and are negative.   Blood pressure 130/79, pulse 63, temperature 99 F (37.2 C), temperature source Oral, resp. rate 20, height '5\' 7"'  (1.702 m), weight 175 lb (79.4 kg), SpO2 98 %.Body mass index is 27.41 kg/m.  General Appearance: Fairly Groomed  Eye Contact:  Poor  Speech:  Clear and Coherent  Volume:  Decreased  Mood:  Depressed  Affect:  Depressed  Thought Process:  Coherent and Goal Directed  Orientation:  Full (Time, Place, and Person)  Thought Content:  Logical Perceptions: denies AH/VH  Suicidal Thoughts:  No  Homicidal Thoughts:  No  Memory:  Immediate;   Good Recent;   Good Remote;   Good  Judgement:  Impaired  Insight:  Shallow  Psychomotor Activity:  Normal  Concentration:  Concentration: Good and Attention Span: Good  Recall:  Good  Fund of Knowledge:  Good  Language:  Good  Akathisia:  No  Handed:  Right  AIMS (if indicated):     Assets:  Physical Health  ADL's:  Intact  Cognition:  WNL  Sleep:      Assessment Corrigan [name] is a 26 year old male with bipolar disorder per chart, alcohol use disorder, marijuana use disorder, who presented to ED after taking half of a small bottle of ibuprofen.   Exam is notable for his poor insight into his act of overdosing ibuprofen. Although he denies this as suicide attempt and denies SI, he is at high risk for self harm given his recent  act and he endorses neurovegetative symptoms in the setting of being unable to see his son, discordance with his ex-fiancee. He needs inpatient stabilization and will make psychiatry referral. Will also need further evaluation in regards to his bipolar disorder; unclear whether he had hypomanic/manic episode in the past and/or whether it was related to his substance use. Continue to monitor.   Plan - Referral for Involuntary psychiatry admission  - No psychotropics started at this time   Treatment Plan Summary: Daily contact with patient to assess and evaluate symptoms and progress in treatment  Norman Clay, MD [name], 1:44 PM

## 2016-07-30 NOTE — ED Notes (Signed)
Report to include Situation, Background, Assessment, and Recommendations received from Dawnley RN. Patient alert and oriented, warm and dry, in no acute distress. Patient denies SI, HI, AVH and pain. Patient made aware of Q15 minute rounds and security cameras for their safety. Patient instructed to come to me with needs or concerns. 

## 2016-07-30 NOTE — ED Provider Notes (Signed)
Patient attempting to leave as we were confiscating his phone for [facility] assessment, has not been evaluated by psychiatry yet, placed under IVC by me until able to cooperate and given Geodon, Benadryl, and Ativan for agitation as he became confrontational.   Lyndal Pulley, MD 07/30/16 0002

## 2016-07-30 NOTE — ED Notes (Signed)
Hourly rounding reveals patient in hall. No complaints, stable, in no acute distress. Q15 minute rounds and monitoring via Security Cameras to continue. Snack and beverage given. 

## 2016-07-30 NOTE — ED Notes (Signed)
Pt. Lying down in room under blankets.

## 2016-07-30 NOTE — ED Notes (Signed)
Pt.agitated yelling refusing to give up his cell phone. Officers called back to get the phone from patient. Dr. Clydene Pugh consulted and back here to see patient with verbal order to give IM meds after IVC initiated.

## 2016-07-30 NOTE — Clinical Social Work Note (Signed)
   Per [facility] patient has a bed at [facility] today 306 - 2 but he cannot go over until 8:30pm.  .Elray Buba, LCSW [facility] Clinical Social Worker - Weekend Coverage

## 2016-07-31 DIAGNOSIS — F1290 Cannabis use, unspecified, uncomplicated: Secondary | ICD-10-CM

## 2016-07-31 DIAGNOSIS — F3130 Bipolar disorder, current episode depressed, mild or moderate severity, unspecified: Secondary | ICD-10-CM

## 2016-07-31 DIAGNOSIS — F1994 Other psychoactive substance use, unspecified with psychoactive substance-induced mood disorder: Secondary | ICD-10-CM

## 2016-07-31 DIAGNOSIS — Z79899 Other long term (current) drug therapy: Secondary | ICD-10-CM

## 2016-07-31 LAB — LIPID PANEL
CHOLESTEROL: 124 mg/dL (ref 0–200)
HDL: 40 mg/dL — ABNORMAL LOW (ref >40)
LDL CALC: 65 mg/dL (ref 0–99)
Total CHOL/HDL Ratio: 3.1 RATIO
Triglycerides: 97 mg/dL (ref <150)
VLDL: 19 mg/dL (ref 0–40)

## 2016-07-31 LAB — TSH: TSH: 1.577 u[IU]/mL (ref 0.350–4.500)

## 2016-07-31 MED ORDER — TRAZODONE HCL 50 MG PO TABS
50.0000 mg | ORAL_TABLET | Freq: Every evening | ORAL | Status: DC | PRN
  Administered 2016-07-31 – 2016-08-02 (×5): 50 mg via ORAL
  Filled 2016-07-31 (×12): qty 1

## 2016-07-31 MED ORDER — TRAZODONE HCL 50 MG PO TABS
ORAL_TABLET | ORAL | Status: AC
  Filled 2016-07-31: qty 1

## 2016-07-31 MED ORDER — SERTRALINE HCL 25 MG PO TABS
25.0000 mg | ORAL_TABLET | Freq: Every day | ORAL | Status: DC
  Administered 2016-07-31 – 2016-08-03 (×4): 25 mg via ORAL
  Filled 2016-07-31 (×7): qty 1

## 2016-07-31 MED ORDER — PNEUMOCOCCAL VAC POLYVALENT 25 MCG/0.5ML IJ INJ
0.5000 mL | INJECTION | INTRAMUSCULAR | Status: AC
  Administered 2016-08-01: 0.5 mL via INTRAMUSCULAR

## 2016-07-31 NOTE — Progress Notes (Signed)
Nursing Progress Note 7p-7a  D) Patient presents pleasant and cooperative. Patient is hypomanic and hyperverbal. Patient states "I don't sleep well at all, I will be up all night". Patient requests medication for sleep. Patient is seen interactive in the milieu. Patient denies SI/HI/AVH or pain. Patient contracts for safety on the unit. Patient did attend group.  A) Emotional support given. 1:1 interaction and active listening provided. Patient medicated with PM orders as prescribed. Medications reviewed with patient. Patient verbalized understanding of medications without further questions.  Snacks and fluids provided. Opportunities for questions or concerns presented to patient. Patient encouraged to continue to work on treatment goals. Labs, vital signs and patient behavior monitored throughout shift. Patient safety maintained with q15 min safety checks.  R) Patient receptive to interaction with nurse. Patient remains safe on the unit at this time. Patient denies any adverse medication reactions at this time. Patient is resting in bed without complaints. Will continue to monitor.

## 2016-07-31 NOTE — Progress Notes (Signed)
Pt is a 26 year old male admitted to [facility] involuntarily after he overdosed on an unspecified amount of Ibuprofen.  He reports he is here because "yesterday I took a mouthful of Ibuprofen.  I did not take it to commit suicide.  I felt overwhelmed, I had nobody to talk to, I've been dealing with depression for a long time now.  I thought if I did it, people would listen to me and take what I'm saying seriously."  Pt denies SI/HI, denies hallucinations, denies pain.  He reports history of asthma, Bipolar disorder, ADHD, and partial left thumb amputation.  He reports he smokes marijuana occasionally and drinks alcohol rarely.  He reports stressors consisting of: loss of relationship with mother of his son, financial issues that may cause him to become homeless, lack of support system.  Pt is focused on discharging and he reports he feels like he does not need to be here because he was not trying to kill himself.    Introduced self to pt.  Admission process and paperwork completed with pt.  Non-invasive body assessment completed.  Pt has multiple tattoos.  He has scars on his R hand, L wrist, R knee, scar on L thumb from partial amputation.  Pt is calm and cooperative with admission process.  Belongings searched for contraband and items not allowed on unit are in locker 33.  Pt oriented to unit and unit routine.  Pt provided with meal and beverage.  On-site provider contacted for admission orders.  Medication administered per order.  PRN medication administered for anxiety.  Encouragement and support offered.    Pt is safe on the unit.  He verbally contracts for safety and reports he will inform staff of needs and concerns.  Will continue to monitor and assess.

## 2016-07-31 NOTE — Progress Notes (Signed)
Adult Psychoeducational Group Note  Date:  07/31/2016 Time:  9:34 PM  Group Topic/Focus:  Wrap-Up Group:   The focus of this group is to help patients review their daily goal of treatment and discuss progress on daily workbooks.  Participation Level:  Active  Participation Quality:  Appropriate  Affect:  Appropriate  Cognitive:  Alert  Insight: Appropriate  Engagement in Group:  Engaged  Modes of Intervention:  Discussion  Additional Comments:  Patient expressed having a good day.  Johnnette Laux L Wilfred Siverson 07/31/2016, 9:34 PM

## 2016-07-31 NOTE — [facility] Group Notes (Signed)
[facility] Group Notes:  (Clinical Social Work)  07/31/2016  10:00AM-11:00AM  Summary of Progress/Problems:  The main focus of today's process group was to listen to a variety of genres of music and to identify that different types of music provoke different responses.  The patient then was able to identify personally what was soothing for them, as well as energizing, as well as how patient can personally use this knowledge in sleep habits, with depression, and with other symptoms.  The patient expressed at the beginning of group the overall feeling of "a mixture of depressed/angry and content, if that makes sense."  At the end of group he said he still had the depression/anger underlying and added, "that's always going to be there until I get my issues resolved."  He said is was "more content" however.  Type of Therapy:  Music Therapy   Participation Level:  Active  Participation Quality:  Attentive and Sharing  Affect:  Blunted  Cognitive:  Oriented  Insight:  Engaged  Engagement in Therapy:  Engaged  Modes of Intervention:   Activity, Exploration  Ambrose Mantle, LCSW 07/31/2016

## 2016-07-31 NOTE — Tx Team (Signed)
Initial Treatment Plan 07/31/2016 12:15 AM Daniel [name] [id number]    PATIENT STRESSORS: Financial difficulties Loss of relationship with son's mother Occupational concerns   PATIENT STRENGTHS: Ability for insight Average or above average intelligence Communication skills General fund of knowledge Physical Health   PATIENT IDENTIFIED PROBLEMS: SI  depression    "work on a support system"  "coping skills"             DISCHARGE CRITERIA:  Improved stabilization in mood, thinking, and/or behavior  PRELIMINARY DISCHARGE PLAN: Outpatient therapy Return to previous living arrangement  PATIENT/FAMILY INVOLVEMENT: This treatment plan has been presented to and reviewed with the patient, Daniel [name].  The patient and family have been given the opportunity to ask questions and make suggestions.  [name], [place] 07/31/2016, 12:15 AM

## 2016-07-31 NOTE — H&P (Signed)
Psychiatric Admission Assessment Adult  Patient Identification: Daniel [name] MRN:  [id number] Date of Evaluation:  07/31/2016 Chief Complaint:  Bipolar disorder Marijuana Use disorder Principal Diagnosis: Bipolar disorder current episode depressed (HCC) Diagnosis:  There are no active problems to display for this patient.  History of Present Illness:  Per chart review, Daniel [name], 26 yo AAMsingle, unemployed lives alone. Brought to the ER by emergency services.  Involuntarily committed. Overdosed on 10,000 mg of ibuprofen. Distressed by recent end of his relationship. He has not been able to see his four year old son in the past two months. Reported to be irritable and guarded about his intent. Very focused on being discharged. UDS and alcohol were negative.   Upon assessment, patient states that his intention was not to kill self.   He states that he is going through tremendous amount of stress with recent breakup with girlfriend.  He feels isolated and alone since she had been gone.  He feels that it was impulsive.    Associated Signs/Symptoms: Depression Symptoms:  depressed mood, (Hypo) Manic Symptoms:  Impulsivity, Anxiety Symptoms:  Excessive Worry, Psychotic Symptoms:  NA PTSD Symptoms: NA Total Time spent with patient: 45 minutes  Past Psychiatric History: see HPI  Is the patient at risk to self? Yes.    Has the patient been a risk to self in the past 6 months? Yes.    Has the patient been a risk to self within the distant past? Yes.    Is the patient a risk to others? No.  Has the patient been a risk to others in the past 6 months? No.  Has the patient been a risk to others within the distant past? No.   Prior Inpatient Therapy:   Prior Outpatient Therapy:    Alcohol Screening: 1. How often do you have a drink containing alcohol?: Monthly or less 2. How many drinks containing alcohol do you have on a typical day when you are drinking?: 1 or 2 3. How often do you  have six or more drinks on one occasion?: Never Preliminary Score: 0 4. How often during the last year have you found that you were not able to stop drinking once you had started?: Never 5. How often during the last year have you failed to do what was normally expected from you becasue of drinking?: Never 6. How often during the last year have you needed a first drink in the morning to get yourself going after a heavy drinking session?: Never 7. How often during the last year have you had a feeling of guilt of remorse after drinking?: Never 8. How often during the last year have you been unable to remember what happened the night before because you had been drinking?: Never 9. Have you or someone else been injured as a result of your drinking?: No 10. Has a relative or friend or a doctor or another health worker been concerned about your drinking or suggested you cut down?: No Alcohol Use Disorder Identification Test Final Score (AUDIT): 1 Brief Intervention: AUDIT score less than 7 or less-screening does not suggest unhealthy drinking-brief intervention not indicated Substance Abuse History in the last 12 months:  Yes.   Consequences of Substance Abuse: NA Previous Psychotropic Medications: Yes  Psychological Evaluations: Yes  Past Medical History:  Past Medical History:  Diagnosis Date  . ADHD (attention deficit hyperactivity disorder)   . Alcohol consumption binge drinking   . Asthma   . Bipolar disorder (HCC)  Past Surgical History:  Procedure Laterality Date  . 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 No pertinent family history. Family Psychiatric  History: see HPI Tobacco Screening: Have you used any form of tobacco in the last 30 days? (Cigarettes, Smokeless Tobacco, Cigars, and/or Pipes): No Social History:  History  Alcohol Use No     History  Drug Use  . Types: Marijuana    Additional Social History: Marital status: Single Are you  sexually active?: No What is your sexual orientation?: Straight Does patient have children?: Yes How many children?: 1 How is patient's relationship with their children?: 97 months old son - good, but has not seen him since breaking up with his son's mother because she feels pt has to get himself under control.    Pain Medications: denies Prescriptions: denies Over the Counter: denies History of alcohol / drug use?: Yes Longest period of sobriety (when/how long): unsure Name of Substance 1: Marijuana 1 - Age of First Use: 26 years of age 33 - Amount (size/oz): About a blunt per week 1 - Frequency: occasional 1 - Duration: Off and on 1 - Last Use / Amount: Two weeks ago.                  Allergies:   Allergies  Allergen Reactions  . Iodine Rash  . Shellfish Allergy Rash   Lab Results:  Results for orders placed or performed during the hospital encounter of 07/30/16 (from the past 48 hour(s))  Lipid panel     Status: Abnormal   Collection Time: 07/31/16  6:26 AM  Result Value Ref Range   Cholesterol 124 0 - 200 mg/dL   Triglycerides 97 <161 mg/dL   HDL 40 (L) >09 mg/dL   Total CHOL/HDL Ratio 3.1 RATIO   VLDL 19 0 - 40 mg/dL   LDL Cholesterol 65 0 - 99 mg/dL    Comment:        Total Cholesterol/HDL:CHD Risk Coronary Heart Disease Risk Table                     Men   Women  1/2 Average Risk   3.4   3.3  Average Risk       5.0   4.4  2 X Average Risk   9.6   7.1  3 X Average Risk  23.4   11.0        Use the calculated Patient Ratio above and the CHD Risk Table to determine the patient's CHD Risk.        ATP III CLASSIFICATION (LDL):  <100     mg/dL   Optimal  604-540  mg/dL   Near or Above                    Optimal  130-159  mg/dL   Borderline  981-191  mg/dL   High  >478     mg/dL   Very High Performed at [facility], [address] [place], [place] [address]   TSH     Status: None   Collection Time: 07/31/16  6:26 AM  Result Value Ref Range    TSH 1.577 0.350 - 4.500 uIU/mL    Comment: Performed by a 3rd Generation assay with a functional sensitivity of <=0.01 uIU/mL. Performed at [facility], [address], [place], [place] [address]     Blood Alcohol level:  Lab Results  Component Value Date   ETH <5 07/29/2016   ETH (H) 05/07/2008    173        LOWEST DETECTABLE LIMIT FOR SERUM ALCOHOL IS 5 mg/dL FOR MEDICAL PURPOSES ONLY    Metabolic Disorder Labs:  No results found for: HGBA1C, MPG No results found for: PROLACTIN Lab Results  Component Value Date   CHOL 124 07/31/2016   TRIG 97 07/31/2016   HDL 40 (L) 07/31/2016   CHOLHDL 3.1 07/31/2016   VLDL 19 07/31/2016   LDLCALC 65 07/31/2016    Current Medications: Current Facility-Administered Medications  Medication Dose Route Frequency Provider Last Rate Last Dose  . acetaminophen (TYLENOL) tablet 650 mg  650 mg Oral Q6H PRN Jackelyn Poling, NP      . albuterol (PROVENTIL HFA;VENTOLIN HFA) 108 (90 Base) MCG/ACT inhaler 1-2 puff  1-2 puff Inhalation Q6H PRN Jackelyn Poling, NP      . alum & mag hydroxide-simeth (MAALOX/MYLANTA) 200-200-20 MG/5ML suspension 30 mL  30 mL Oral Q4H PRN Jackelyn Poling, NP      . hydrOXYzine (ATARAX/VISTARIL) tablet 25 mg  25 mg Oral TID PRN Jackelyn Poling, NP   25 mg at 07/31/16 0011  . magnesium hydroxide (MILK OF MAGNESIA) suspension 30 mL  30 mL Oral Daily PRN Jackelyn Poling, NP      . Melene Muller ON 08/01/2016] pneumococcal 23 valent vaccine (PNU-IMMUNE) injection 0.5 mL  0.5 mL Intramuscular Tomorrow-1000 Rockey Situ Cobos, MD      . sertraline (ZOLOFT) tablet 25 mg  25 mg Oral Daily Georgiann Cocker, MD      . traZODone (DESYREL) tablet 50 mg  50 mg Oral QHS,MR X 1 Jackelyn Poling, NP   50 mg at 07/31/16 0010   PTA Medications: Prescriptions Prior to Admission  Medication Sig Dispense Refill Last Dose  . albuterol (PROVENTIL HFA;VENTOLIN HFA) 108 (90 BASE) MCG/ACT inhaler Inhale 1-2 puffs into the lungs every 6 (six) hours  as needed for wheezing or shortness of breath.    Past Month at Unknown time  . ibuprofen (ADVIL,MOTRIN) 200 MG tablet Take 800 mg by mouth every 6 (six) hours as needed for fever, headache, mild pain or moderate pain.   07/29/2016 at 1300    Musculoskeletal: Strength & Muscle Tone: within normal limits Gait & Station: normal Patient leans: N/A  Psychiatric Specialty Exam: Physical Exam  ROS  Blood pressure 124/62, pulse 86, temperature 98.4 F (36.9 C), temperature source Oral, resp. rate 20, height  (1.702 m), weight 79.4 kg (175 lb).Body mass index is 27.41 kg/m.  General Appearance: Fairly Groomed  Eye Contact:  Fair  Speech:  Clear and Coherent  Volume:  Normal  Mood:  Depressed  Affect:  Labile  Thought Process:  Coherent  Orientation:  Full (Time, Place, and Person)  Thought Content:  Rumination  Suicidal Thoughts:  No  Homicidal Thoughts:  No  Memory:  Immediate;   Fair Recent;   Fair Remote;   Fair  Judgement:  Fair  Insight:  Fair  Psychomotor Activity:  Normal  Concentration:  Concentration: Fair and Attention Span: Fair  Recall:  [employer] of Knowledge:  Fair  Language:  Fair  Akathisia:  Yes  Handed:  Right  AIMS (if indicated):     Assets:  Desire for Improvement Resilience Social Support  ADL's:  Intact  Cognition:  WNL  Sleep:  Number of Hours: 4.75   Treatment Plan Summary: Admit for crisis management  and mood stabilization. Medication management to re-stabilize current mood symptoms Group counseling sessions for coping skills Medical consults as needed Review and reinstate any pertinent home medications for other health problems   Observation Level/Precautions:  15 minute checks  Laboratory:  per ED  Psychotherapy:  group  Medications:  As per medlist  Consultations:  As needed  Discharge Concerns:  safety  Estimated LOS:  2-7 days  Other:     Physician Treatment Plan for Primary Diagnosis: Bipolar disorder current episode  depressed (HCC) Long Term Goal(s): Improvement in symptoms so as ready for discharge  Short Term Goals: Ability to identify changes in lifestyle to reduce recurrence of condition will improve, Ability to verbalize feelings will improve, Ability to disclose and discuss suicidal ideas, Ability to demonstrate self-control will improve, Ability to identify and develop effective coping behaviors will improve, Ability to maintain clinical measurements within normal limits will improve, Compliance with prescribed medications will improve and Ability to identify triggers associated with substance abuse/mental health issues will improve  Physician Treatment Plan for Secondary Diagnosis: Active Problems:   * No active hospital problems. *  Long Term Goal(s): Improvement in symptoms so as ready for discharge  Short Term Goals: Ability to identify changes in lifestyle to reduce recurrence of condition will improve, Ability to verbalize feelings will improve, Ability to disclose and discuss suicidal ideas, Ability to demonstrate self-control will improve, Ability to identify and develop effective coping behaviors will improve, Ability to maintain clinical measurements within normal limits will improve, Compliance with prescribed medications will improve and Ability to identify triggers associated with substance abuse/mental health issues will improve  I certify that inpatient services furnished can reasonably be expected to improve the patient's condition.    [facility], NP [facility] 4/15/20183:43 PM

## 2016-07-31 NOTE — Plan of Care (Signed)
Problem: Activity: Goal: Interest or engagement in leisure activities will improve Outcome: Progressing PAtient hs been attending groups and participating in the milieu.

## 2016-07-31 NOTE — [facility] Group Notes (Addendum)
[facility] Group Notes:  (Nursing/MHT/Case Management/Adjunct)  Date:  07/31/2016  Time:  1:59 PM  Type of Therapy:  Nurse Education  Participation Level:  Active  Participation Quality:  Appropriate and Attentive  Affect:  Appropriate  Cognitive:  Alert and Appropriate  Insight:  Appropriate  Engagement in Group:  Engaged  Modes of Intervention:  Activity  Summary of Progress/Problems:  Activity focusing on positive aspects of others, and accepting others positive thoughts about self. Patient was able to support peers, itentify positive attributes in others and accept others positive views of him.  Almira Bar 07/31/2016, 1:59 PM

## 2016-07-31 NOTE — Progress Notes (Signed)
Data. Patient denies SI/HI/AVH. Patient interacting well with staff and other patients. Has been in the dayroom playing chess with a peer for most of the day.Affect has been bright, laughing and smiling noted throughout day.. Patient reports trying to remain, "Positive about stuff. I am really trying to stay well for my son, so I can get to see him." On his self assessment patient reports 10/10 for depression and 7/10 for anxiety and hopelessness. Action. Emotional support and encouragement offered. Education provided on medication, indications and side effect. Q 15 minute checks done for safety. Response. Safety on the unit maintained through 15 minute checks.  Medications taken as prescribed. Attended groups. Remained calm and appropriate through out shift.
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
## 2016-07-31 NOTE — Plan of Care (Signed)
Problem: Safety: Goal: Periods of time without injury will increase Outcome: Progressing Patient is on q15 minute safety checks and contracts for safety on the unit.   

## 2016-07-31 NOTE — [facility] Suicide Risk Assessment (Signed)
[facility] Admission Suicide Risk Assessment   Nursing information obtained from:  Patient Demographic factors:  Male, Low socioeconomic status, Living alone, Unemployed Current Mental Status:  NA Loss Factors:  Loss of significant relationship, Financial problems / change in socioeconomic status Historical Factors:  Family history of mental illness or substance abuse, Impulsivity Risk Reduction Factors:  Responsible for children under 67 years of age  Total Time spent with patient: 30 minutes Principal Problem: MDD                                   Adjustment disorder Diagnosis:   Patient Active Problem List   Diagnosis Date Noted  . Bipolar disorder current episode depressed (HCC) [F31.30] 07/30/2016  . Bipolar disorder, unspecified (HCC) [F31.9] 07/30/2016   Subjective Data:  26 yo Daniel [name] single, unemployed lives alone. Brought to the ER by emergency services. Involuntarily committed. Overdosed on Daniel,000 mg of  ibuprofen. Distressed by recent end of his relationship. He has not been able to see his four year old son in the past two months. Reported to be irritable and guarded about his intent. Very focused on being discharged. UDS and alcohol were negative.   At interview, states that he did not intend to kill self. He just wanted his ex-girlfriend to know that he is hurting. Says he called EMS himself after he took the OD. Says if he was suicidal he had other means of making it work. Says they all used to live together. His ex-girlfriend moved away and left him alone in the apartment. The lease is in her name and she still pays the rent. Says he has been there alone for two months. Says he has been trying to talk to her but she has been refusing to see him. Says he still loves her and wants her back in his life. States that they have talked since he has been here. She wants him to get help. Patient hopes that once he gets help, they can work out their differences. Says he is willing to get into  counseling. Reports difficulty getting into sleep. No associated hallucination. No delusional theme. No passivity of will. No homicidal thoughts.  No past suicidal behavior. Occasional use of THC. No weapons at home.    Continued Clinical Symptoms:  Alcohol Use Disorder Identification Test Final Score (AUDIT): 1 The "Alcohol Use Disorders Identification Test", Guidelines for Use in Primary Care, Second Edition.  World Science writer [facility]). Score between 0-7:  no or low risk or alcohol related problems. Score between 8-15:  moderate risk of alcohol related problems. Score between 16-19:  high risk of alcohol related problems. Score 20 or above:  warrants further diagnostic evaluation for alcohol dependence and treatment.   CLINICAL FACTORS:  Depression Anxiety      Musculoskeletal: Strength & Muscle Tone: within normal limits Gait & Station: normal Patient leans: N/A  Psychiatric Specialty Exam: Physical Exam  Constitutional: He is oriented to person, place, and time. He appears well-developed and well-nourished.  HENT:  Head: Normocephalic and atraumatic.  Eyes: Conjunctivae are normal. Pupils are equal, round, and reactive to light.  Neck: Normal range of motion. Neck supple.  Cardiovascular: Normal rate, regular rhythm and normal heart sounds.   Respiratory: Effort normal and breath sounds normal.  GI: Soft. Bowel sounds are normal.  Musculoskeletal: Normal range of motion.  Neurological: He is alert and oriented to person, place, and time. He  has normal reflexes.  Skin: Skin is warm and dry.  Psychiatric:  As above    ROS  Blood pressure 124/62, pulse 86, temperature 98.4 F (36.9 C), temperature source Oral, resp. rate 20, height  (1.702 m), weight 79.4 kg (175 lb).Body mass index is 27.41 kg/m.  General Appearance: Well groomed. Was playing chess with a peer prior to interview. Pleasant, good relatedness. Appropriate behavior.  Eye Contact:  Good   Speech:  Clear and Coherent and Normal Rate  Volume:  Normal  Mood:  Anxious and Depressed  Affect:  Appropriate and Full Range  Thought Process:  Linear  Orientation:  Full (Time, Place, and Person)  Thought Content:  Worries about status of his relationship. Ruminations about the past. No delusional theme. No preoccupation with violent thoughts. No hallucination in any modality.   Suicidal Thoughts:  No  Homicidal Thoughts:  No  Memory:  Immediate;   Good Recent;   Good Remote;   Good  Judgement:  Fair  Insight:  Good  Psychomotor Activity:  Normal  Concentration:  Concentration: Good and Attention Span: Good  Recall:  Good  Fund of Knowledge:  Good  Language:  Good  Akathisia:  No  Handed:    AIMS (if indicated):     Assets:  Communication Skills Desire for Improvement Physical Health Resilience  ADL's:  Intact  Cognition:  WNL  Sleep:  Number of Hours: 4.75      COGNITIVE FEATURES THAT CONTRIBUTE TO RISK:  None    SUICIDE RISK:   Mild:  Suicidal ideation of limited frequency, intensity, duration, and specificity.  There are no identifiable plans, no associated intent, mild dysphoria and related symptoms, good self-control (both objective and subjective assessment), few other risk factors, and identifiable protective factors, including available and accessible social support.  PLAN OF CARE:  1. Sertraline 25 mg daily 2. Suicide precautions. 3. Voluntary admission.   I certify that inpatient services furnished can reasonably be expected to improve the patient's condition.   Georgiann Cocker, MD 07/31/2016, 1:16 PM

## 2016-08-01 ENCOUNTER — Encounter (HOSPITAL_COMMUNITY): Payer: Self-pay | Admitting: Psychiatry

## 2016-08-01 DIAGNOSIS — F1990 Other psychoactive substance use, unspecified, uncomplicated: Secondary | ICD-10-CM

## 2016-08-01 DIAGNOSIS — F329 Major depressive disorder, single episode, unspecified: Secondary | ICD-10-CM

## 2016-08-01 LAB — HEMOGLOBIN A1C
HEMOGLOBIN A1C: 5.3 % (ref 4.8–5.6)
Mean Plasma Glucose: 105 mg/dL

## 2016-08-01 LAB — PROLACTIN: Prolactin: 17.1 ng/mL — ABNORMAL HIGH (ref 4.0–15.2)

## 2016-08-01 NOTE — Progress Notes (Signed)
Recreation Therapy Notes  INPATIENT RECREATION THERAPY ASSESSMENT  Patient Details Name: Daniel [name] MRN: [id number] DOB: Aug 23, 1990 Today's Date: 08/01/2016  Patient Stressors: Relationship, Other (Comment)  Pt stated he was here for suicide attempt. Pt stated not seeing his son and breaking up with his son's mother were stressors. Pt stated his lease was about to be up and that was a stressor also.  Coping Skills:   Isolate, Substance Abuse, Avoidance, Exercise, Art/Dance, Talking, Music, Sports  Personal Challenges: Anger, Decision-Making, Expressing Yourself, Relationships, Stress Management, Time Management, Trusting Others, Work Nutritional therapist (2+):  Music - Listen, Individual - TV, Individual - Reading  Awareness of Community Resources:  Yes  Community Resources:  Library, [place], [employer], [place], [place]  Current Use: No  If no, Barriers?: Other (Comment) (Don't feel like walking)  Patient Strengths:  Good listener; loyal person  Patient Identified Areas of Improvement:  Anger, getting feelings across appropriately  Current Recreation Participation:  Daily  Patient Goal for Hospitalization:  "No specific goal"  [place] of Residence:  [place] of Residence:  [place]  Current [place] (including self-harm):  No  Current HI:  No  Consent to Intern Participation: N/A   Caroll Rancher, LRT/CTRS  Caroll Rancher A 08/01/2016, 3:02 PM

## 2016-08-01 NOTE — Progress Notes (Signed)
Recreation Therapy Notes  Date: 08/01/16 Time: 1000 Location: 300 Hall Dayroom  Group Topic: Coping Skills  Goal Area(s) Addresses:  Patient will be able to identify positive coping skills. Patient will be able to identify the benefits of coping skills. Patient will be able to identify benefits of using of coping skills post d/c.  Behavioral Response: Engaged  Intervention: Wellsite geologist, magazines, glue sticks, Holiday representative paper  Activity: Counsellor.  Patients were to identify coping skills that could be used for diversions, socially, cognitively, tension releasers and physically.  Patients were to cut out pictures from magazines to represent their coping skills for each category.  Education: Pharmacologist, Building control surveyor.   Education Outcome: Acknowledges understanding/In group clarification offered/Needs additional education.   Clinical Observations/Feedback: Pt was bright and social with peers.  Pt worked diligently on his sheet until group was ended.   Daniel [name], LRT/CTRS         Daniel [name] A 08/01/2016 12:43 PM

## 2016-08-01 NOTE — [facility] Group Notes (Signed)
[facility] LCSW Group Therapy  08/01/2016 1:15pm  Type of Therapy: Group Therapy   Topic: Overcoming Obstacles  Participation Level: Pt invited. Did not attend.  Jonathon Jordan, MSW, Theresia Majors [phone]

## 2016-08-01 NOTE — Progress Notes (Signed)
Adult Psychoeducational Group Note  Date:  08/01/2016 Time:  8:54 PM  Group Topic/Focus:  Wrap-Up Group:   The focus of this group is to help patients review their daily goal of treatment and discuss progress on daily workbooks.  Participation Level:  Active  Participation Quality:  Appropriate  Affect:  Appropriate  Cognitive:  Appropriate  Insight: Appropriate  Engagement in Group:  Engaged  Modes of Intervention:  Discussion  Additional Comments:  The patient expressed that he attended groups .The patient also said that he rates today a 7. Octavio Manns 08/01/2016, 8:54 PM

## 2016-08-01 NOTE — Progress Notes (Signed)
[facility] MD Progress Note  08/01/2016 1:33 PM Daniel [name]  MRN:  [id number] Subjective:   26 yo AAM single, unemployed lives alone. Brought to the ER by emergency services. Involuntarily committed. Overdosed on 10,000 mg of  ibuprofen. Distressed by recent end of his relationship. He has not been able to see his four year old son in the past two months. Reported to be irritable and guarded about his intent. Very focused on being discharged. UDS and alcohol were negative.   Nursing staff reports that he has been interactive. He was on the phone with his girlfriend yesterday. Patient was heard negotiating ways they can work their differences out. He participated at some of the groups. No behavioral issues. He has not required any emergency medications. He is not withdrawn. He has not been observed to be internally stimulated.   Seen today. Chart reviewed prior to interview. Was playing chess at the day room. Tells me that he has been feeling better. Says groups and interaction with others has really helped him. His girlfriend is now supportive since he is seeking treatment. She has promised to bring their four month old son in today. Patient is looking forward to the visitation. Says he is excited. No suicidal thoughts. No homicidal thoughts. No infanticidal thoughts. Says he slept well last night after taking his medication. No racing thoughts. No evidence of psychosis.   Principal Problem: Bipolar disorder current episode depressed (HCC) Diagnosis:  There are no active problems to display for this patient.  Total Time spent with patient: 20 minutes   Past Psychiatric History: As in H&P  Past Medical History:  Past Medical History:  Diagnosis Date  . ADHD (attention deficit hyperactivity disorder)   . Alcohol consumption binge drinking   . Asthma   . Bipolar disorder [facility])     Past Surgical History:  Procedure Laterality Date  . 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 No pertinent family history. Family Psychiatric  History: As in H&P Social History:  History  Alcohol Use No     History  Drug Use  . Types: Marijuana    Social History   Social History  . Marital status: Single    Spouse name: N/A  . Number of children: N/A  . Years of education: N/A   Social History Main Topics  . Smoking status: Never Smoker  . Smokeless tobacco: Never Used  . Alcohol use No  . Drug use: Yes    Types: Marijuana  . Sexual activity: Yes   Other Topics Concern  . None   Social History Narrative  . None   Additional Social History:    Pain Medications: denies Prescriptions: denies Over the Counter: denies History of alcohol / drug use?: Yes Longest period of sobriety (when/how long): unsure Name of Substance 1: Marijuana 1 - Age of First Use: 26 years of age 76 - Amount (size/oz): About a blunt per week 1 - Frequency: occasional 1 - Duration: Off and on 1 - Last Use / Amount: Two weeks ago.       Sleep: Good  Appetite:  Good  Current Medications: Current Facility-Administered Medications  Medication Dose Route Frequency Provider Last Rate Last Dose  . acetaminophen (TYLENOL) tablet 650 mg  650 mg Oral Q6H PRN Jackelyn Poling, NP      . albuterol (PROVENTIL HFA;VENTOLIN HFA) 108 (90 Base) MCG/ACT inhaler 1-2 puff  1-2 puff Inhalation Q6H PRN Jackelyn Poling, NP      .  alum & mag hydroxide-simeth (MAALOX/MYLANTA) 200-200-20 MG/5ML suspension 30 mL  30 mL Oral Q4H PRN Jackelyn Poling, NP      . hydrOXYzine (ATARAX/VISTARIL) tablet 25 mg  25 mg Oral TID PRN Jackelyn Poling, NP   25 mg at 07/31/16 1631  . magnesium hydroxide (MILK OF MAGNESIA) suspension 30 mL  30 mL Oral Daily PRN Jackelyn Poling, NP      . sertraline (ZOLOFT) tablet 25 mg  25 mg Oral Daily Georgiann Cocker, MD   25 mg at 08/01/16 0109  . traZODone (DESYREL) tablet 50 mg  50 mg Oral QHS,MR X 1 Jackelyn Poling, NP   50 mg at 07/31/16 2156    Lab Results:  Results for orders  placed or performed during the hospital encounter of 07/30/16 (from the past 48 hour(s))  Hemoglobin A1c     Status: None   Collection Time: 07/31/16  6:26 AM  Result Value Ref Range   Hgb A1c MFr Bld 5.3 4.8 - 5.6 %    Comment: (NOTE)         Pre-diabetes: 5.7 - 6.4         Diabetes: >6.4         Glycemic control for adults with diabetes: <7.0    Mean Plasma Glucose 105 mg/dL    Comment: (NOTE) Performed At: [facility] [address] [place], [place] [id number] Mila Homer MD [id number] Performed at [facility], [address] [place], [place] [address]   Lipid panel     Status: Abnormal   Collection Time: 07/31/16  6:26 AM  Result Value Ref Range   Cholesterol 124 0 - 200 mg/dL   Triglycerides 97 <176 mg/dL   HDL 40 (L) >16 mg/dL   Total CHOL/HDL Ratio 3.1 RATIO   VLDL 19 0 - 40 mg/dL   LDL Cholesterol 65 0 - 99 mg/dL    Comment:        Total Cholesterol/HDL:CHD Risk Coronary Heart Disease Risk Table                     Men   Women  1/2 Average Risk   3.4   3.3  Average Risk       5.0   4.4  2 X Average Risk   9.6   7.1  3 X Average Risk  23.4   11.0        Use the calculated Patient Ratio above and the CHD Risk Table to determine the patient's CHD Risk.        ATP III CLASSIFICATION (LDL):  <100     mg/dL   Optimal  073-710  mg/dL   Near or Above                    Optimal  130-159  mg/dL   Borderline  626-948  mg/dL   High  >546     mg/dL   Very High Performed at [facility], [address] [place], [place] [address]   TSH     Status: None   Collection Time: 07/31/16  6:26 AM  Result Value Ref Range   TSH 1.577 0.350 - 4.500 uIU/mL    Comment: Performed by a 3rd Generation assay with a functional sensitivity of <=0.01 uIU/mL. Performed at [facility], [address] [place], [place] [address]   Prolactin     Status: Abnormal   Collection  Time: 07/31/16  6:26 AM  Result Value Ref Range    Prolactin 17.1 (H) 4.0 - 15.2 ng/mL    Comment: (NOTE) Performed At: [facility] [address] [place], [place] [id number] Mila Homer MD [id number] Performed at [facility], [address], [place], [place] [address]     Blood Alcohol level:  Lab Results  Component Value Date   ETH <5 07/29/2016   ETH (H) 05/07/2008    173        LOWEST DETECTABLE LIMIT FOR SERUM ALCOHOL IS 5 mg/dL FOR MEDICAL PURPOSES ONLY    Metabolic Disorder Labs: Lab Results  Component Value Date   HGBA1C 5.3 07/31/2016   MPG 105 07/31/2016   Lab Results  Component Value Date   PROLACTIN 17.1 (H) 07/31/2016   Lab Results  Component Value Date   CHOL 124 07/31/2016   TRIG 97 07/31/2016   HDL 40 (L) 07/31/2016   CHOLHDL 3.1 07/31/2016   VLDL 19 07/31/2016   LDLCALC 65 07/31/2016    Physical Findings: AIMS: Facial and Oral Movements Muscles of Facial Expression: None, normal Lips and Perioral Area: None, normal Jaw: None, normal Tongue: None, normal,Extremity Movements Upper (arms, wrists, hands, fingers): None, normal Lower (legs, knees, ankles, toes): None, normal, Trunk Movements Neck, shoulders, hips: None, normal, Overall Severity Severity of abnormal movements (highest score from questions above): None, normal Incapacitation due to abnormal movements: None, normal Patient's awareness of abnormal movements (rate only patient's report): No Awareness, Dental Status Current problems with teeth and/or dentures?: No Does patient usually wear dentures?: No  CIWA:  CIWA-Ar Total: 1 COWS:  COWS Total Score: 1  Musculoskeletal: Strength & Muscle Tone: within normal limits Gait & Station: normal Patient leans: N/A  Psychiatric Specialty Exam: Physical Exam  Constitutional: He is oriented to person, place, and time. He appears well-developed and well-nourished.  HENT:  Head: Normocephalic and atraumatic.  Eyes: Conjunctivae are normal. Pupils are  equal, round, and reactive to light.  Neck: Normal range of motion. Neck supple.  Cardiovascular: Normal rate, regular rhythm and normal heart sounds.   Respiratory: Effort normal and breath sounds normal.  GI: Soft. Bowel sounds are normal.  Musculoskeletal: Normal range of motion.  Neurological: He is alert and oriented to person, place, and time.  Skin: Skin is warm and dry.  Psychiatric:  As above.     ROS  Blood pressure 120/66, pulse 88, temperature 98 F (36.7 C), resp. rate 16, height  (1.702 m), weight 79.4 kg (175 lb).Body mass index is 27.41 kg/m.  General Appearance: Neatly dressed, calm and cooperative. Appropriate behavior. Not internally distracted   Eye Contact:  Good  Speech:  Clear and Coherent  Volume:  Normal  Mood:  Euthymic  Affect:  Appropriate and Full Range  Thought Process:  Goal Directed and Linear  Orientation:  Full (Time, Place, and Person)  Thought Content:  Future oriented. No delusional theme. No preoccupation with violent thoughts. No negative ruminations. No obsession.  No hallucination in any modality.   Suicidal Thoughts:  No  Homicidal Thoughts:  No  Memory:  Immediate;   Good Recent;   Good Remote;   Good  Judgement:  Good  Insight:  Good  Psychomotor Activity:  Normal  Concentration:  Concentration: Good and Attention Span: Good  Recall:  Good  Fund of Knowledge:  Good  Language:  Good  Akathisia:  No  Handed:    AIMS (if indicated):  Assets:  Communication Skills Desire for Improvement Physical Health Resilience  ADL's:  Intact  Cognition:  WNL  Sleep:  Number of Hours: 6.5     Treatment Plan Summary:  Patient is not pervasively depressed. He is no longer having suicidal thoughts. He is working on resolving the crisis that precipitated current admission. He is tolerating medications well. We are still evaluating him.   Psychiatric: MDD SUD  Medical:  Psychosocial:  Recent breakup Unemployment  PLAN: 1.  Continue current regimen 2. Encourage unit groups and activities 3. Continue to monitor mood, behavior and interaction with peers 4. SW would facilitate aftercare  5. Collateral from his girlfriend.    Georgiann Cocker, MD 08/01/2016, 1:33 PM

## 2016-08-01 NOTE — [facility] Group Notes (Signed)
[facility] LCSW Aftercare Discharge Planning Group Note   08/01/2016  8:45 AM  Participation Quality: Pt invited. Did not attend.     Jonathon Jordan, MSW, LCSWA 08/01/2016 1:05 PM

## 2016-08-01 NOTE — Progress Notes (Addendum)
D: Pt denies SI/HI/AVH. Pt is pleasant and cooperative. Pt stated he was doing better and having decreased anxiety/ depression. Pt stated most of his anxiety comes from him not being able to see his son.   A: Pt was offered support and encouragement. Pt was given scheduled medications. Pt was encourage to attend groups. Q 15 minute checks were done for safety.   R:Pt attends groups and interacts well with peers and staff. Pt is taking medication. Pt has no complaints.Pt receptive to treatment and safety maintained on unit.

## 2016-08-01 NOTE — Progress Notes (Signed)
DAR NOTE: Pt present with flat affect and depressed mood in the unit. Pt has been observed interacting with peers in the day room. Pt denies physical pain, took all his meds as scheduled. As per self inventory, pt had a good night sleep, good appetite, normal energy, and good concentration. Pt rate depression at 6, hopeless ness at 3, and anxiety at 4. Pt's safety ensured with 15 minute and environmental checks. Pt currently denies SI/HI and A/V hallucinations. Pt verbally agrees to seek staff if SI/HI or A/VH occurs and to consult with staff before acting on these thoughts. Will continue POC.

## 2016-08-02 DIAGNOSIS — Z813 Family history of other psychoactive substance abuse and dependence: Secondary | ICD-10-CM

## 2016-08-02 DIAGNOSIS — F332 Major depressive disorder, recurrent severe without psychotic features: Secondary | ICD-10-CM | POA: Diagnosis present

## 2016-08-02 DIAGNOSIS — F1990 Other psychoactive substance use, unspecified, uncomplicated: Secondary | ICD-10-CM

## 2016-08-02 DIAGNOSIS — Z818 Family history of other mental and behavioral disorders: Secondary | ICD-10-CM

## 2016-08-02 NOTE — Progress Notes (Signed)
[facility] Group Notes:  (Nursing/MHT/Case Management/Adjunct)  Date:  08/02/2016  Time:  9:27 PM  Type of Therapy:  Psychoeducational Skills  Participation Level:  Active  Participation Quality:  Appropriate  Affect:  Appropriate  Cognitive:  Appropriate  Insight:  Good  Engagement in Group:  Engaged  Modes of Intervention:  Education  Summary of Progress/Problems: Patient states that he learned today how he can deal with certain situations more appropriately. His goal for tomorrow is to try to be more active than today.   Hazle Coca S 08/02/2016, 9:27 PM

## 2016-08-02 NOTE — [facility] Group Notes (Signed)
[facility] LCSW Group Therapy  08/02/2016 2:01 PM   Type of Therapy:  Group Therapy  Participation Level:  Active  Participation Quality:  Attentive  Affect:  Appropriate  Cognitive:  Appropriate  Insight:  Improving  Engagement in Therapy:  Engaged  Modes of Intervention:  Clarification, Education, Exploration and Socialization  Summary of Progress/Problems: Today's group focused on resilience. Stayed the entire time, engaged throughout.  "I was trying to break the cycle of single parent families and incarceration that runs in my family.  When I had my son,  I kept trying to make it work with his mother. I was trying to find and focus on the positives.  We didn't stay together, but I can say I gave it my best shot. And now I am trying to get along with her for the sake of my son."  Engaged with multiple other group members during the course of the group. Daniel [name] 08/02/2016 , 2:01 PM

## 2016-08-02 NOTE — Progress Notes (Signed)
[facility] MD Progress Note  08/02/2016 1:52 PM Daniel [name]  MRN:  [id number] Subjective:  Patient states " I am still depressed, I am sleeping better and my anxiety is less.'   Objective:Patient seen and chart reviewed.Discussed patient with treatment team. Pt today seen as more visible in milieu. Pt reports that he is improving, however continues to feel sad. Pt reports he is tolerating zoloft well, wants to give medications time. Pt encouraged to attend groups and participate.     Principal Problem: MDD (major depressive disorder) Diagnosis:   Patient Active Problem List   Diagnosis Date Noted  . MDD (major depressive disorder) [F32.9] 08/01/2016  . Substance use disorder [F19.90] 08/01/2016   Total Time spent with patient: 20 minutes  Past Psychiatric History: As in H&P  Past Medical History:  Past Medical History:  Diagnosis Date  . ADHD (attention deficit hyperactivity disorder)   . Alcohol consumption binge drinking   . Asthma   . Bipolar disorder [facility])     Past Surgical History:  Procedure Laterality Date  . 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 Breast cancer Mother   . Depression Mother   . Drug abuse Mother   . ADD / ADHD Sister   . Bipolar disorder Sister   . Drug abuse Sister   . Suicidality Cousin    Family Psychiatric  History: As in H&P Social History:  History  Alcohol Use No     History  Drug Use  . Types: Marijuana    Social History   Social History  . Marital status: Single    Spouse name: N/A  . Number of children: N/A  . Years of education: N/A   Social History Main Topics  . Smoking status: Never Smoker  . Smokeless tobacco: Never Used  . Alcohol use No  . Drug use: Yes    Types: Marijuana  . Sexual activity: Yes   Other Topics Concern  . None   Social History Narrative  . None   Additional Social History:    Pain Medications: denies Prescriptions:  denies Over the Counter: denies History of alcohol / drug use?: Yes Longest period of sobriety (when/how long): unsure Name of Substance 1: Marijuana 1 - Age of First Use: 26 years of age 75 - Amount (size/oz): About a blunt per week 1 - Frequency: occasional 1 - Duration: Off and on 1 - Last Use / Amount: Two weeks ago.       Sleep: Fair  Appetite:  Fair  Current Medications: Current Facility-Administered Medications  Medication Dose Route Frequency Provider Last Rate Last Dose  . acetaminophen (TYLENOL) tablet 650 mg  650 mg Oral Q6H PRN Jackelyn Poling, NP      . albuterol (PROVENTIL HFA;VENTOLIN HFA) 108 (90 Base) MCG/ACT inhaler 1-2 puff  1-2 puff Inhalation Q6H PRN Jackelyn Poling, NP      . alum & mag hydroxide-simeth (MAALOX/MYLANTA) 200-200-20 MG/5ML suspension 30 mL  30 mL Oral Q4H PRN Jackelyn Poling, NP      . hydrOXYzine (ATARAX/VISTARIL) tablet 25 mg  25 mg Oral TID PRN Jackelyn Poling, NP   25 mg at 08/01/16 2110  . magnesium hydroxide (MILK OF MAGNESIA) suspension 30 mL  30 mL Oral Daily PRN Jackelyn Poling, NP      . sertraline (ZOLOFT) tablet 25 mg  25 mg Oral Daily Georgiann Cocker, MD  25 mg at 08/02/16 0806  . traZODone (DESYREL) tablet 50 mg  50 mg Oral QHS,MR X 1 Jackelyn Poling, NP   50 mg at 08/01/16 2110    Lab Results:  No results found for this or any previous visit (from the past 48 hour(s)).  Blood Alcohol level:  Lab Results  Component Value Date   ETH <5 07/29/2016   ETH (H) 05/07/2008    173        LOWEST DETECTABLE LIMIT FOR SERUM ALCOHOL IS 5 mg/dL FOR MEDICAL PURPOSES ONLY    Metabolic Disorder Labs: Lab Results  Component Value Date   HGBA1C 5.3 07/31/2016   MPG 105 07/31/2016   Lab Results  Component Value Date   PROLACTIN 17.1 (H) 07/31/2016   Lab Results  Component Value Date   CHOL 124 07/31/2016   TRIG 97 07/31/2016   HDL 40 (L) 07/31/2016   CHOLHDL 3.1 07/31/2016   VLDL 19 07/31/2016   LDLCALC 65 07/31/2016    Physical  Findings: AIMS: Facial and Oral Movements Muscles of Facial Expression: None, normal Lips and Perioral Area: None, normal Jaw: None, normal Tongue: None, normal,Extremity Movements Upper (arms, wrists, hands, fingers): None, normal Lower (legs, knees, ankles, toes): None, normal, Trunk Movements Neck, shoulders, hips: None, normal, Overall Severity Severity of abnormal movements (highest score from questions above): None, normal Incapacitation due to abnormal movements: None, normal Patient's awareness of abnormal movements (rate only patient's report): No Awareness, Dental Status Current problems with teeth and/or dentures?: No Does patient usually wear dentures?: No  CIWA:  CIWA-Ar Total: 1 COWS:  COWS Total Score: 1  Musculoskeletal: Strength & Muscle Tone: within normal limits Gait & Station: normal Patient leans: N/A  Psychiatric Specialty Exam: Physical Exam  Nursing note and vitals reviewed.   Review of Systems  Psychiatric/Behavioral: Positive for depression. The patient is nervous/anxious.   All other systems reviewed and are negative.   Blood pressure 109/66, pulse 93, temperature 97.8 F (36.6 C), temperature source Oral, resp. rate 16, height  (1.702 m), weight 79.4 kg (175 lb).Body mass index is 27.41 kg/m.  General Appearance: well groomed   Eye Contact:  Fair  Speech:  Normal Rate  Volume:  Normal  Mood:  Depressed  Affect:  Congruent  Thought Process:  Linear and Descriptions of Associations: Circumstantial  Orientation:  Full (Time, Place, and Person)  Thought Content: goal directed  Suicidal Thoughts:  No  Homicidal Thoughts:  No  Memory:  Immediate;   Good Recent;   Fair Remote;   Fair  Judgement:  Fair  Insight:  Fair  Psychomotor Activity:  Normal  Concentration:  Concentration: Fair and Attention Span: Fair  Recall:  [employer] of Knowledge:  Fair  Language:  Fair  Akathisia:  No  Handed:    AIMS (if indicated):     Assets:   Manufacturing systems engineer Physical Health  ADL's:  Intact  Cognition:  WNL  Sleep:  Number of Hours: 6.5     Treatment Plan Summary:Patient presents today , reports improvement in his mood , continue treatment.    PLAN: Continue Zoloft 25 mg po daily for affective sx. Trazodone 50 mg po qhs for insomnia. Continue to encourage to attend groups and participate. CSW will continue to work on disposition.  Caitlen Worth, MD 08/02/2016, 1:52 PM

## 2016-08-02 NOTE — Progress Notes (Addendum)
Nursing Progress Note 7p-7a  D) Patient presents calm and cooperative. Patient is seen interactive in the milieu. Patient states "the medicine is helping me sleep". Patient denies SI/HI/AVH or pain. Patient contracts for safety on the unit. Patient expressed interest in becoming voluntarily admitted. Patient encouraged to talk to the provider tomorrow.  A) Emotional support given. 1:1 interaction and active listening provided. Patient medicated with PM orders as prescribed. Medications reviewed with patient. Patient verbalized understanding of medications without further questions.  Snacks and fluids provided. Opportunities for questions or concerns presented to patient. Patient encouraged to continue to work on treatment goals. Labs, vital signs and patient behavior monitored throughout shift. Patient safety maintained with q15 min safety checks.  R) Patient receptive to interaction with nurse. Patient remains safe on the unit at this time. Patient denies any adverse medication reactions at this time. Patient is resting in bed without complaints. Will continue to monitor.

## 2016-08-02 NOTE — Progress Notes (Signed)
Recreation Therapy Notes  Date: 08/02/16 Time: 1000 Location: [address] Dayroom   Group Topic: Communication, Team Building, Problem Solving  Goal Area(s) Addresses:  Patient will effectively work with peer towards shared goal.  Patient will identify skill used to make activity successful.  Patient will identify how skills used during activity can be used to reach post d/c goals.   Behavioral Response: Engaged  Intervention: STEM Activity   Activity: [employer]. Patients were provided the following materials: 5 drinking straws, 5 rubber bands, 5 paper clips, 2 index cards, 2 drinking cups, and 2 toilet paper rolls. Using the provided materials patients were asked to build a launching mechanisms to launch a ping pong ball approximately 12 feet. Patients were divided into teams of 3-5.   Education: Pharmacist, community, Building control surveyor.   Education Outcome: Acknowledges education/In group clarification offered/Needs additional education.   Clinical Observations/Feedback: Pt was bright and engaged in activity.  Pt stated the group and to use communication and problem solving to complete the launcher.  Pt expressed that using the skills from this group would allow him to "think outside the box to come up with solutions" when dealing with his support system.    Caroll Rancher, LRT/CTRS         Caroll Rancher A 08/02/2016 11:56 AM

## 2016-08-02 NOTE — Progress Notes (Signed)
Recreation Therapy Notes   Animal-Assisted Activity (AAA) Program Checklist/Progress Notes Patient Eligibility Criteria Checklist & Daily Group note for Rec Tx Intervention  Date: 04.17.2018 Time: 2:45pm Location: [address]    AAA/T Program Assumption of Risk Form signed by Patient/ or Parent Legal Guardian Yes  Patient is free of allergies or sever asthma Yes  Patient reports no fear of animals Yes  Patient reports no history of cruelty to animals Yes  Patient understands his/her participation is voluntary Yes  Patient washes hands before animal contact Yes  Patient washes hands after animal contact Yes  Behavioral Response: Engaged, Attentive, Appropriate    Education: Hand Washing, Appropriate Animal Interaction   Education Outcome: Acknowledges education.   Clinical Observations/Feedback: Patient discussed with MD for appropriateness in pet therapy session. Both LRT and MD agree patient is appropriate for participation. Patient offered participation in session and signed necessary consent form without issue. Patient attended session and engaged with therapy dog and peers appropriately during session.   Marykay Lex Sophina Mitten, LRT/CTRS        Raisha Brabender L 08/02/2016 3:00 PM

## 2016-08-02 NOTE — [facility] Suicide Risk Assessment (Signed)
[facility] INPATIENT:  Family/Significant Other Suicide Prevention Education  Suicide Prevention Education:  Contact Attempts: Cato Mulligan (ex-fiance [phone]), has been identified by the patient as the family member/significant other with whom the patient will be residing, and identified as the person(s) who will aid the patient in the event of a mental health crisis.  With written consent from the patient, two attempts were made to provide suicide prevention education, prior to and/or following the patient's discharge.  We were unsuccessful in providing suicide prevention education.  A suicide education pamphlet was given to the patient to share with family/significant other.  Date and time of first attempt: 08/02/16 @ 8:40am. No answer, CSW left a message.   Jonathon Jordan 08/02/2016, 8:43 AM

## 2016-08-02 NOTE — Plan of Care (Signed)
Problem: Health Behavior/Discharge Planning: Goal: Compliance with treatment plan for underlying cause of condition will improve Outcome: Progressing Patient is attending groups and taking medications as prescribed. Patient verbalized understanding of current therapeutic regimen.

## 2016-08-02 NOTE — Progress Notes (Signed)
D: Daniel [name] has been pleasant and cooperative this morning. He denied SI, HI, and AVH. He reported fair sleep, good appetite, normal energy level, and good concentration. He rated his depression 7/10, hopelessness 0/10, and anxiety 5/10. He has been interacting well with others in the milieu, and has voiced no needs/concerns when this writer has offered him the opportunity.   A: Meds given as ordered. Q15 safety checks maintained. Support/encouragement offered.  R: Pt remains free from harm and continues with treatment. Will continue to monitor for needs/safety.

## 2016-08-02 NOTE — Tx Team (Signed)
Interdisciplinary Treatment and Diagnostic Plan Update  08/02/2016 Time of Session: 2:06 PM  Wayburn Shaler MRN: [id number]  Principal Diagnosis: Major depressive disorder, recurrent severe without psychotic features [facility])  Secondary Diagnoses: Principal Problem:   Major depressive disorder, recurrent severe without psychotic features ([place]) Active Problems:   Substance use disorder   Current Medications:  Current Facility-Administered Medications  Medication Dose Route Frequency Provider Last Rate Last Dose  . acetaminophen (TYLENOL) tablet 650 mg  650 mg Oral Q6H PRN Rozetta Nunnery, NP      . albuterol (PROVENTIL HFA;VENTOLIN HFA) 108 (90 Base) MCG/ACT inhaler 1-2 puff  1-2 puff Inhalation Q6H PRN Rozetta Nunnery, NP      . alum & mag hydroxide-simeth (MAALOX/MYLANTA) 200-200-20 MG/5ML suspension 30 mL  30 mL Oral Q4H PRN Rozetta Nunnery, NP      . hydrOXYzine (ATARAX/VISTARIL) tablet 25 mg  25 mg Oral TID PRN Rozetta Nunnery, NP   25 mg at 08/01/16 2110  . magnesium hydroxide (MILK OF MAGNESIA) suspension 30 mL  30 mL Oral Daily PRN Rozetta Nunnery, NP      . sertraline (ZOLOFT) tablet 25 mg  25 mg Oral Daily Artist Beach, MD   25 mg at 08/02/16 0806  . traZODone (DESYREL) tablet 50 mg  50 mg Oral QHS,MR X 1 Rozetta Nunnery, NP   50 mg at 08/01/16 2110    PTA Medications: Prescriptions Prior to Admission  Medication Sig Dispense Refill Last Dose  . albuterol (PROVENTIL HFA;VENTOLIN HFA) 108 (90 BASE) MCG/ACT inhaler Inhale 1-2 puffs into the lungs every 6 (six) hours as needed for wheezing or shortness of breath.    Past Month at Unknown time  . ibuprofen (ADVIL,MOTRIN) 200 MG tablet Take 800 mg by mouth every 6 (six) hours as needed for fever, headache, mild pain or moderate pain.   07/29/2016 at 1300    Treatment Modalities: Medication Management, Group therapy, Case management,  1 to 1 session with clinician, Psychoeducation, Recreational therapy.   Physician Treatment Plan for  Primary Diagnosis: Major depressive disorder, recurrent severe without psychotic features ([place]) Long Term Goal(s): Improvement in symptoms so as ready for discharge  Short Term Goals: Ability to identify changes in lifestyle to reduce recurrence of condition will improve Ability to verbalize feelings will improve Ability to disclose and discuss suicidal ideas Ability to demonstrate self-control will improve Ability to identify and develop effective coping behaviors will improve Ability to maintain clinical measurements within normal limits will improve Compliance with prescribed medications will improve Ability to identify triggers associated with substance abuse/mental health issues will improve Ability to identify changes in lifestyle to reduce recurrence of condition will improve Ability to verbalize feelings will improve Ability to disclose and discuss suicidal ideas Ability to demonstrate self-control will improve Ability to identify and develop effective coping behaviors will improve Ability to maintain clinical measurements within normal limits will improve Compliance with prescribed medications will improve Ability to identify triggers associated with substance abuse/mental health issues will improve  Medication Management: Evaluate patient's response, side effects, and tolerance of medication regimen.  Therapeutic Interventions: 1 to 1 sessions, Unit Group sessions and Medication administration.  Evaluation of Outcomes: Progressing  Physician Treatment Plan for Secondary Diagnosis: Principal Problem:   Major depressive disorder, recurrent severe without psychotic features ([place]) Active Problems:   Substance use disorder   Long Term Goal(s): Improvement in symptoms so as ready for discharge  Short Term Goals: Ability to identify changes in lifestyle  to reduce recurrence of condition will improve Ability to verbalize feelings will improve Ability to disclose and discuss  suicidal ideas Ability to demonstrate self-control will improve Ability to identify and develop effective coping behaviors will improve Ability to maintain clinical measurements within normal limits will improve Compliance with prescribed medications will improve Ability to identify triggers associated with substance abuse/mental health issues will improve Ability to identify changes in lifestyle to reduce recurrence of condition will improve Ability to verbalize feelings will improve Ability to disclose and discuss suicidal ideas Ability to demonstrate self-control will improve Ability to identify and develop effective coping behaviors will improve Ability to maintain clinical measurements within normal limits will improve Compliance with prescribed medications will improve Ability to identify triggers associated with substance abuse/mental health issues will improve  Medication Management: Evaluate patient's response, side effects, and tolerance of medication regimen.  Therapeutic Interventions: 1 to 1 sessions, Unit Group sessions and Medication administration.  Evaluation of Outcomes: Progressing   RN Treatment Plan for Primary Diagnosis: Major depressive disorder, recurrent severe without psychotic features ([place]) Long Term Goal(s): Knowledge of disease and therapeutic regimen to maintain health will improve  Short Term Goals: Ability to verbalize feelings will improve and Ability to identify and develop effective coping behaviors will improve  Medication Management: RN will administer medications as ordered by provider, will assess and evaluate patient's response and provide education to patient for prescribed medication. RN will report any adverse and/or side effects to prescribing provider.  Therapeutic Interventions: 1 on 1 counseling sessions, Psychoeducation, Medication administration, Evaluate responses to treatment, Monitor vital signs and CBGs as ordered, Perform/monitor  CIWA, COWS, AIMS and Fall Risk screenings as ordered, Perform wound care treatments as ordered.  Evaluation of Outcomes: Progressing   LCSW Treatment Plan for Primary Diagnosis: Major depressive disorder, recurrent severe without psychotic features ([place]) Long Term Goal(s): Safe transition to appropriate next level of care at discharge, Engage patient in therapeutic group addressing interpersonal concerns.  Short Term Goals: Engage patient in aftercare planning with referrals and resources  Therapeutic Interventions: Assess for all discharge needs, 1 to 1 time with Social worker, Explore available resources and support systems, Assess for adequacy in community support network, Educate family and significant other(s) on suicide prevention, Complete Psychosocial Assessment, Interpersonal group therapy.  Evaluation of Outcomes: Met  Return home, follow up [facility]   Progress in Treatment: Attending groups: Yes Participating in groups: Yes Taking medication as prescribed: Yes Toleration medication: Yes, no side effects reported at this time Family/Significant other contact made: No  Left message Patient understands diagnosis: Yes AEB asking for help with anxiety, depression, increased stress Discussing patient identified problems/goals with staff: Yes Medical problems stabilized or resolved: Yes Denies suicidal/homicidal ideation: Yes Issues/concerns per patient self-inventory: None Other: N/A  New problem(s) identified: None identified at this time.   New Short Term/Long Term Goal(s): None identified at this time.   Discharge Plan or Barriers:   Reason for Continuation of Hospitalization: Anxiety Depression Medical Issues Medication stabilization   Estimated Length of Stay: 3-5 days  Attendees: Patient: 08/02/2016  2:06 PM  Physician: Ursula Alert, MD 08/02/2016  2:06 PM  Nursing: Hoy Register, RN 08/02/2016  2:06 PM  RN Care Manager: Lars Pinks, RN 08/02/2016  2:06  PM  Social Worker: Ripley Fraise 08/02/2016  2:06 PM  Recreational Therapist: Laretta Bolster  08/02/2016  2:06 PM  Other: Norberto Sorenson 08/02/2016  2:06 PM  Other:  08/02/2016  2:06 PM    Scribe for Treatment Team:  [place] LCSW 08/02/2016 2:06 PM
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
## 2016-08-03 MED ORDER — TRAZODONE HCL 50 MG PO TABS
50.0000 mg | ORAL_TABLET | Freq: Every evening | ORAL | Status: DC | PRN
  Administered 2016-08-03: 50 mg via ORAL
  Filled 2016-08-03: qty 1
  Filled 2016-08-03: qty 7

## 2016-08-03 MED ORDER — SERTRALINE HCL 50 MG PO TABS
50.0000 mg | ORAL_TABLET | Freq: Every day | ORAL | Status: DC
  Administered 2016-08-04: 50 mg via ORAL
  Filled 2016-08-03: qty 1
  Filled 2016-08-03 (×2): qty 7
  Filled 2016-08-03: qty 1

## 2016-08-03 NOTE — Progress Notes (Signed)
D:  Patient's self inventory sheet, patient has poor sleep, sleep medication not helpful.  Good appetite, high energy level, good concentration.  Rated depression 3, denied hopeless, anxiety 2.  Denied withdrawals.  Denied SI.  Denied physical problems.  Denied pain.  Pain medication is helpful.  No discharge plans. A:  Medications administered per MD orders.  Emotional support and encouragement given patient. R:  Denied SI and HI, contracts for safety.   Denied A/V hallucinations.  Safety maintained with 15 minute checks.

## 2016-08-03 NOTE — Progress Notes (Signed)
[facility] MD Progress Note  08/03/2016 6:02 PM Daniel [name]  MRN:  [id number] Subjective:  Patient reports he is feeling better than on admission, and as he improves is focusing more on discharging soon. At this time denies medication side effects. Denies any current suicidal ideations .  Objective : I have discussed case with treatment team and have met with patient. Patient is a 26 year old male, admitted after an NSAID overdose, which he states was impulsive and in the context of significant stressors. Reported recent break up and not having seen his young son for several weeks. Of note, states that at this time his relationship with his son's mother has improved and that she has agreed to allow him to see child as long as he demonstrates he is doing better and is focused on treatment. States this has in turn helped him feel better and more motivated . No disruptive or agitated behaviors on unit. Going to some groups, visible in milieu. Denies medication side effects.  Principal Problem: Major depressive disorder, recurrent severe without psychotic features ([place]) Diagnosis:   Patient Active Problem List   Diagnosis Date Noted  . Major depressive disorder, recurrent severe without psychotic features ([place]) [F33.2] 08/02/2016  . Substance use disorder [F19.90] 08/01/2016   Total Time spent with patient:  20 minutes   Past Psychiatric History: As in H&P  Past Medical History:  Past Medical History:  Diagnosis Date  . ADHD (attention deficit hyperactivity disorder)   . Alcohol consumption binge drinking   . Asthma   . Bipolar disorder [facility])     Past Surgical History:  Procedure Laterality Date  . 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 Breast cancer Mother   . Depression Mother   . Drug abuse Mother   . ADD / ADHD Sister   . Bipolar disorder Sister   . Drug abuse Sister   . Suicidality Cousin    Family Psychiatric   History: As in H&P Social History:  History  Alcohol Use No     History  Drug Use  . Types: Marijuana    Social History   Social History  . Marital status: Single    Spouse name: N/A  . Number of children: N/A  . Years of education: N/A   Social History Main Topics  . Smoking status: Never Smoker  . Smokeless tobacco: Never Used  . Alcohol use No  . Drug use: Yes    Types: Marijuana  . Sexual activity: Yes   Other Topics Concern  . None   Social History Narrative  . None   Additional Social History:    Pain Medications: denies Prescriptions: denies Over the Counter: denies History of alcohol / drug use?: Yes Longest period of sobriety (when/how long): unsure Name of Substance 1: Marijuana 1 - Age of First Use: 26 years of age 82 - Amount (size/oz): About a blunt per week 1 - Frequency: occasional 1 - Duration: Off and on 1 - Last Use / Amount: Two weeks ago.       Sleep: Good  Appetite:  Good  Current Medications: Current Facility-Administered Medications  Medication Dose Route Frequency Provider Last Rate Last Dose  . acetaminophen (TYLENOL) tablet 650 mg  650 mg Oral Q6H PRN Rozetta Nunnery, NP   650 mg at 08/03/16 0823  . albuterol (PROVENTIL HFA;VENTOLIN HFA) 108 (90 Base) MCG/ACT inhaler 1-2 puff  1-2  puff Inhalation Q6H PRN Rozetta Nunnery, NP      . alum & mag hydroxide-simeth (MAALOX/MYLANTA) 200-200-20 MG/5ML suspension 30 mL  30 mL Oral Q4H PRN Rozetta Nunnery, NP      . hydrOXYzine (ATARAX/VISTARIL) tablet 25 mg  25 mg Oral TID PRN Rozetta Nunnery, NP   25 mg at 08/02/16 2134  . magnesium hydroxide (MILK OF MAGNESIA) suspension 30 mL  30 mL Oral Daily PRN Rozetta Nunnery, NP      . sertraline (ZOLOFT) tablet 25 mg  25 mg Oral Daily Artist Beach, MD   25 mg at 08/03/16 0819  . traZODone (DESYREL) tablet 50 mg  50 mg Oral QHS,MR X 1 Rozetta Nunnery, NP   50 mg at 08/02/16 2132    Lab Results:  No results found for this or any previous visit (from the  past 48 hour(s)).  Blood Alcohol level:  Lab Results  Component Value Date   ETH <5 07/29/2016   ETH (H) 05/07/2008    173        LOWEST DETECTABLE LIMIT FOR SERUM ALCOHOL IS 5 mg/dL FOR MEDICAL PURPOSES ONLY    Metabolic Disorder Labs: Lab Results  Component Value Date   HGBA1C 5.3 07/31/2016   MPG 105 07/31/2016   Lab Results  Component Value Date   PROLACTIN 17.1 (H) 07/31/2016   Lab Results  Component Value Date   CHOL 124 07/31/2016   TRIG 97 07/31/2016   HDL 40 (L) 07/31/2016   CHOLHDL 3.1 07/31/2016   VLDL 19 07/31/2016   LDLCALC 65 07/31/2016    Physical Findings: AIMS: Facial and Oral Movements Muscles of Facial Expression: None, normal Lips and Perioral Area: None, normal Jaw: None, normal Tongue: None, normal,Extremity Movements Upper (arms, wrists, hands, fingers): None, normal Lower (legs, knees, ankles, toes): None, normal, Trunk Movements Neck, shoulders, hips: None, normal, Overall Severity Severity of abnormal movements (highest score from questions above): None, normal Incapacitation due to abnormal movements: None, normal Patient's awareness of abnormal movements (rate only patient's report): No Awareness, Dental Status Current problems with teeth and/or dentures?: No Does patient usually wear dentures?: No  CIWA:  CIWA-Ar Total: 1 COWS:  COWS Total Score: 2  Musculoskeletal: Strength & Muscle Tone: within normal limits Gait & Station: normal Patient leans: N/A  Psychiatric Specialty Exam: Physical Exam  Nursing note and vitals reviewed.   Review of Systems  Psychiatric/Behavioral: Positive for depression. The patient is nervous/anxious.   All other systems reviewed and are negative. denies headache, denies chest pain, denies abdominal pain, describes vague nausea, but no vomiting, denies melenas   Blood pressure 118/69, pulse 92, temperature 97.8 F (36.6 C), temperature source Oral, resp. rate 18, height 5' 7" (1.702 m), weight 79.4  kg (175 lb).Body mass index is 27.41 kg/m.  General Appearance: improved grooming   Eye Contact:  Good  Speech:  Normal Rate  Volume:  Normal  Mood:  minimizes depression at this time, states he is feeling better   Affect:  Appropriate and fuller in range, vaguely anxious  Thought Process:  Goal Directed and Descriptions of Associations: Intact  Orientation:  Other:  fully alert and attentive   Thought Content: no hallucinations, no delusions   Suicidal Thoughts:  No denies any suicidal or self injurious ideations, denies any homicidal or violent ideations, specifically also denies any homicidal ideations towards ex GF   Homicidal Thoughts:  No  Memory: recent and remote grossly intact   Judgement:  Fair- improving   Insight:  Improving   Psychomotor Activity:  Normal  Concentration:  Concentration: Good and Attention Span: Good  Recall:  Good  Fund of Knowledge:  Good  Language:  Good  Akathisia:  Negative  Handed:  Right  AIMS (if indicated):     Assets:  Desire for Improvement Physical Health  ADL's:  Intact  Cognition:  WNL  Sleep:  Number of Hours: 6.25   Assessment - patient reports feeling better, less depressed, more hopeful, and denies any suicidal ideations at this time. Denies medication side effects ( on low dose Zoloft ) . Focused on being discharged soon.   Treatment Plan Summary: Treatment plan reviewed as below today 4/18     PLAN: Continue group and milieu participation to work on coping skills and symptom reduction Increase  Zoloft to 50  mg po daily for depression, anxiety . Continue Trazodone 50 mg po qhs PRN for insomnia as needed . Continue Vistaril 25 mgrs Q 8 hours PRN for anxiety as needed  Continue to encourage to attend groups and participate. CSW will continue to work on disposition.  Jenne Campus, MD 08/03/2016, 6:02 PM   Patient ID: Daniel [name], male   DOB: [birth year], 26 y.o.   MRN: [id number]

## 2016-08-03 NOTE — [facility] Group Notes (Signed)
[facility] Group Notes:  (Counselor/Nursing/MHT/Case Management/Adjunct)  08/03/2016 1:15PM  Type of Therapy:  Group Therapy  Participation Level:  Active  Participation Quality:  Appropriate  Affect:  Flat  Cognitive:  Oriented  Insight:  Improving  Engagement in Group:  Limited  Engagement in Therapy:  Limited  Modes of Intervention:  Discussion, Exploration and Socialization  Summary of Progress/Problems: The topic for group was balance in life.  Pt participated in the discussion about when their life was in balance and out of balance and how this feels.  Pt discussed ways to get back in balance and short term goals they can work on to get where they want to be. Emotional balance is being in the center, which is a place I'm not in very often."  "I don't think I have ever been emotionally balanced.  I have lots of highs and lows, and don't feel like I can find equilibrium.  I often explode at others, and that's a bad thing."  Went on to talk about son and how that has been a difficult thing to deal with in terms of not being able to see him for 2 months.  Talked about swimming in the ocean, and how he loves to do that.   Daniel [name] B 08/03/2016 1:22 PM

## 2016-08-03 NOTE — Progress Notes (Signed)
The patient attended group, but refused to share.

## 2016-08-03 NOTE — Plan of Care (Signed)
Problem: Education: Goal: Utilization of techniques to improve thought processes will improve Outcome: Progressing Nurse discussed depression/anxiety/coping skills with patient.    

## 2016-08-03 NOTE — Progress Notes (Signed)
Nursing Progress Note 7p-7a  D) Patient presents irritable and labile this evening. Patient reports he is upset because "my girlfriend was supposed to visit but didn't". Patient observed talking to a peer about being upset and states "I feel comfortable with her, so I am going to open up about it, but I'm not going to group". Patient refused to speak 1:1 to Clinical research associate. Patient is seen interactive in the milieu. Patient denies SI/HI/AVH or pain. Patient contracts for safety on the unit.  A) Emotional support given. 1:1 interaction and active listening provided. Patient medicated with PM orders as prescribed. Medications reviewed with patient. Patient verbalized understanding of medications without further questions.  Snacks and fluids provided. Opportunities for questions or concerns presented to patient. Patient encouraged to continue to work on treatment goals. Labs, vital signs and patient behavior monitored throughout shift. Patient safety maintained with q15 min safety checks.  R) Patient receptive to interaction with nurse. Patient remains safe on the unit at this time. Patient denies any adverse medication reactions at this time. Patient is resting in bed without complaints. Will continue to monitor.

## 2016-08-03 NOTE — Plan of Care (Signed)
Problem: Activity: Goal: Sleeping patterns will improve Outcome: Progressing Patient reports he is sleeping well with current medication regimen.

## 2016-08-03 NOTE — Progress Notes (Signed)
Recreation Therapy Notes  Date: 08/03/16 Time: 1000 Location: 300 Hall Dayroom  Group Topic: Anger Management  Goal Area(s) Addresses:  Patient will identify triggers for anger.  Patient will identify physical reaction to anger.   Patient will identify benefit of using coping skills when angry.  Behavioral Response: Engaged  Intervention: Worksheets, pencils  Activity: Anger Umbrella.  Patients were given a worksheet with an umbrella on it.  Patients were to identify the situations that cause them to get angry and write them inside the umbrella.  Patients were to then identify coping skills they could use to deal with their anger and write it on the outside of the umbrella.  Education:Anger Management, Discharge Planning   Education Outcome: Acknowledges education/In group clarification offered/Needs additional education.   Clinical Observations/Feedback: Pt expressed when he gets angry he wants to punch someone in the face.  Pt stated he gets angry with annoying people, tall people and people not being honest.  Pt explained he deals with his anger by vaping, wrestling, watching television and eating.  Pt stated using his coping skills will help to relieve stress.   Caroll Rancher, LRT/CTRS         Caroll Rancher A 08/03/2016 12:32 PM

## 2016-08-04 MED ORDER — TRAZODONE HCL 50 MG PO TABS: 50.0000 mg | ORAL_TABLET | Freq: Every evening | ORAL | 0 refills | Status: DC | PRN

## 2016-08-04 MED ORDER — ALBUTEROL SULFATE HFA 108 (90 BASE) MCG/ACT IN AERS: 1.0000 | INHALATION_SPRAY | Freq: Four times a day (QID) | RESPIRATORY_TRACT | Status: DC | PRN

## 2016-08-04 MED ORDER — SERTRALINE HCL 50 MG PO TABS: 50.0000 mg | ORAL_TABLET | Freq: Every day | ORAL | 0 refills | Status: DC

## 2016-08-04 MED ORDER — HYDROXYZINE HCL 25 MG PO TABS: 25.0000 mg | ORAL_TABLET | Freq: Three times a day (TID) | ORAL | 0 refills | Status: DC | PRN

## 2016-08-04 NOTE — Progress Notes (Signed)
Pt discharged to lobby. Pt was stable and appreciative at that time. All papers, samples and prescriptions were given and valuables returned. Verbal understanding expressed. Denies SI/HI and A/VH. Pt given opportunity to express concerns and ask questions.  

## 2016-08-04 NOTE — Discharge Summary (Signed)
Physician Discharge Summary Note  Patient:  Gurtej Noyola is an 26 y.o., male MRN:  [id number] DOB:  1990-10-23 Patient phone:  [phone] (home)  Patient address:   [address] [place] [address],  Total Time spent with patient: Greater than 30 minutes  Date of Admission:  07/30/2016  Date of Discharge: Greater than 30 minutes  Reason for Admission: Suicide attempt by overdose.  Principal Problem: Major depressive disorder, recurrent severe without psychotic features [facility])  Discharge Diagnoses: Patient Active Problem List   Diagnosis Date Noted  . Major depressive disorder, recurrent severe without psychotic features ([facility]) [F33.2] 08/02/2016  . Substance use disorder [F19.90] 08/01/2016   Past Psychiatric History: Major depressive disorder  Past Medical History:  Past Medical History:  Diagnosis Date  . ADHD (attention deficit hyperactivity disorder)   . Alcohol consumption binge drinking   . Asthma   . Bipolar disorder [facility])     Past Surgical History:  Procedure Laterality Date  . 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 Breast cancer Mother   . Depression Mother   . Drug abuse Mother   . ADD / ADHD Sister   . Bipolar disorder Sister   . Drug abuse Sister   . Suicidality Cousin    Family Psychiatric  History: See H&P Social History:  History  Alcohol Use No     History  Drug Use  . Types: Marijuana    Social History   Social History  . Marital status: Single    Spouse name: N/A  . Number of children: N/A  . Years of education: N/A   Social History Main Topics  . Smoking status: Never Smoker  . Smokeless tobacco: Never Used  . Alcohol use No  . Drug use: Yes    Types: Marijuana  . Sexual activity: Yes   Other Topics Concern  . None   Social History Narrative  . None   Hospital Course: Vedanth Sirico, 26 yo AAMsingle, unemployed lives alone. Brought to the ER by  emergency services.  Involuntarily committed. Overdosed on 10,000 mg of ibuprofen. Distressed by recent end of his relationship. He has not been able to see his four year old son in the past two months. Reported to be irritable and guarded about his intent. Very focused on being discharged. UDS and alcohol were negative. Upon assessment, patient states that his intention was not to kill himself.   He states that he is going through tremendous amount of stress with recent breakup with girlfriend.  He feels isolated and alone since she had been gone.  He feels that it was impulsive.  Caylon was admitted to the [facility] adult unit for crisis management due to worsening suicidal thoughts & an attempt by overdose. He cited relationship break-up & inability to see his son as the triggers. He was in need of mood stabilization treatments. During the course of his hospitalization, Ritter was medicated & discharged on, Sertraline 50 mg mg for depression, Hydroxyzine 25 mg prn for anxiety & Trazodone 50 mg insomnia. He was enrolled & participated in the group counseling sessions being offered & held on this unit. He was counseled & learned coping skills that should help him cope better & maintain mood stability after discharge. Drayven was resumed on all his pertinent home medications for the other previously existing medical issues presented. He tolerated his treatment regimen without any adverse effects reported.  While his treatment was on going, Lavi's improvement was monitored by observation & his daily reports of symptom reduction noted.  His emotional & mental status were monitored by daily self-inventory reports completed by him & the clinical staff. He was evaluated daily by the treatment team for mood stability & the need for continued recovery after discharge. Osbaldo was offered further treatment options upon discharge & will follow up with the outpatient psychiatric services as listed below.     Jehu is seen today  for discharge. He has normal anxiety about going home. He is not overwhelmed by this. He is looking forward to working on his mental health on an outpatient basis. Not expressing any delusions today. No hallucination. Feels in control of himself. No passivity of thought. No passivity of will. No fantasy about suicide lately. No suicidal thoughts. No thoughts of violence.  Does not feel depressed. No evidence of mania.  Nursing staff reports that Oracio has been appropriate on the unit. He has been interacting well with peers. No behavioral issues. He has not voiced any suicidal thoughts. Glendell has not been observed to be internally stimulated or preoccupied. He has been adherent with treatment recommendations. He has been tolerating his medication well. No reported adverse effects or reactions.   Corbin was discussed at the treatment team meeting this morning. Team members feels that patient is back to his baseline level of function. Team agrees with plan to discharge patient today. Sherley was provided with a 7 days worth, supply samples of his [facility] discharge medications. He left [facility] with all personal belongings in no apparent distress. Transportation per friend.    Physical Findings: AIMS: Facial and Oral Movements Muscles of Facial Expression: None, normal Lips and Perioral Area: None, normal Jaw: None, normal Tongue: None, normal,Extremity Movements Upper (arms, wrists, hands, fingers): None, normal Lower (legs, knees, ankles, toes): None, normal, Trunk Movements Neck, shoulders, hips: None, normal, Overall Severity Severity of abnormal movements (highest score from questions above): None, normal Incapacitation due to abnormal movements: None, normal Patient's awareness of abnormal movements (rate only patient's report): No Awareness, Dental Status Current problems with teeth and/or dentures?: No Does patient usually wear dentures?: No  CIWA:  CIWA-Ar Total: 1 COWS:  COWS Total Score:  2  Musculoskeletal: Strength & Muscle Tone: within normal limits Gait & Station: normal Patient leans: N/A  Psychiatric Specialty Exam: Physical Exam  Constitutional: He appears well-developed.  HENT:  Head: Normocephalic.  Eyes: Pupils are equal, round, and reactive to light.  Cardiovascular: Normal rate.   Respiratory: Effort normal.  GI: Soft.  Genitourinary:  Genitourinary Comments: Deferred  Musculoskeletal: Normal range of motion.  Neurological: He is alert.  Skin: Skin is warm and dry.    Review of Systems  Constitutional: Negative.   HENT: Negative.   Eyes: Negative.   Respiratory: Negative.   Cardiovascular: Negative.   Gastrointestinal: Negative.   Genitourinary: Negative.   Musculoskeletal: Negative.   Skin: Negative.   Neurological: Negative.   Endo/Heme/Allergies: Negative.   Psychiatric/Behavioral: Positive for depression (Stable). Negative for hallucinations, memory loss, substance abuse and suicidal ideas. The patient has insomnia (Stable). The patient is not nervous/anxious.     Blood pressure 118/69, pulse 92, temperature 97.8 F (36.6 C), temperature source Oral, resp. rate 18, height  (1.702 m), weight 79.4 kg (175 lb).Body mass index is 27.41 kg/m.  See Md's SRA   Have you used any form of tobacco in the last 30 days? (Cigarettes, Smokeless Tobacco, Cigars, and/or  Pipes): No  Has this patient used any form of tobacco in the last 30 days? (Cigarettes, Smokeless Tobacco, Cigars, and/or Pipes)No  Blood Alcohol level:  Lab Results  Component Value Date   ETH <5 07/29/2016   ETH (H) 05/07/2008    173        LOWEST DETECTABLE LIMIT FOR SERUM ALCOHOL IS 5 mg/dL FOR MEDICAL PURPOSES ONLY   Metabolic Disorder Labs:  Lab Results  Component Value Date   HGBA1C 5.3 07/31/2016   MPG 105 07/31/2016   Lab Results  Component Value Date   PROLACTIN 17.1 (H) 07/31/2016   Lab Results  Component Value Date   CHOL 124 07/31/2016   TRIG 97  07/31/2016   HDL 40 (L) 07/31/2016   CHOLHDL 3.1 07/31/2016   VLDL 19 07/31/2016   LDLCALC 65 07/31/2016   See Psychiatric Specialty Exam and Suicide Risk Assessment completed by Attending Physician prior to discharge.  Discharge destination:  Home  Is patient on multiple antipsychotic therapies at discharge:  No   Has Patient had three or more failed trials of antipsychotic monotherapy by history:  No  Recommended Plan for Multiple Antipsychotic Therapies: NA  Allergies as of 08/04/2016      Reactions   Iodine Rash   Shellfish Allergy Rash      Medication List    STOP taking these medications   ibuprofen 200 MG tablet Commonly known as:  ADVIL,MOTRIN     TAKE these medications     Indication  albuterol 108 (90 Base) MCG/ACT inhaler Commonly known as:  PROVENTIL HFA;VENTOLIN HFA Inhale 1-2 puffs into the lungs every 6 (six) hours as needed for wheezing or shortness of breath.  Indication:  Asthma   hydrOXYzine 25 MG tablet Commonly known as:  ATARAX/VISTARIL Take 1 tablet (25 mg total) by mouth 3 (three) times daily as needed for anxiety.  Indication:  Anxiety Neurosis   sertraline 50 MG tablet Commonly known as:  ZOLOFT Take 1 tablet (50 mg total) by mouth daily. For depression Start taking on:  08/05/2016  Indication:  Major Depressive Disorder   traZODone 50 MG tablet Commonly known as:  DESYREL Take 1 tablet (50 mg total) by mouth at bedtime as needed for sleep.  Indication:  Trouble Sleeping      Follow-up Information    [facility] Follow up.   Specialty:  Behavioral Health Why:  Please go for a walk-in appointment within 1-3 days of discharge to be established for outpatient services. Walk-in hours are Mon-Fri 8AM-3PM. Please arrive as early as possible to be sure that you are seen. [address] [place] [address] [phone]          Follow-up recommendations: Activity:  As tolerated Diet: As recommended by your primary  care doctor. Keep all scheduled follow-up appointments as recommended.   Comments: Patient is instructed prior to discharge to: Take all medications as prescribed by his/her mental healthcare provider. Report any adverse effects and or reactions from the medicines to his/her outpatient provider promptly. Patient has been instructed & cautioned: To not engage in alcohol and or illegal drug use while on prescription medicines. In the event of worsening symptoms, patient is instructed to call the crisis hotline, 911 and or go to the nearest ED for appropriate evaluation and treatment of symptoms. To follow-up with his/her primary care provider for your other medical issues, concerns and or health care needs.   Signed: Sanjuana Kava, NP, PMHNP, FNP-BC 08/04/2016, 11:52 AM  Patient seen, Suicide Assessment Completed.  Disposition Plan Reviewed  

## 2016-08-04 NOTE — BHH Suicide Risk Assessment (Signed)
[facility] Discharge Suicide Risk Assessment   Principal Problem: Major depressive disorder, recurrent severe without psychotic features [facility]) Discharge Diagnoses:  Patient Active Problem List   Diagnosis Date Noted  . Major depressive disorder, recurrent severe without psychotic features (HCC) [F33.2] 08/02/2016  . Substance use disorder [F19.90] 08/01/2016    Total Time spent with patient: 30 minutes  Musculoskeletal: Strength & Muscle Tone: within normal limits Gait & Station: normal Patient leans: N/A  Psychiatric Specialty Exam: ROS  Denies headache, no chest pain, no nausea, no vomiting   Blood pressure 118/69, pulse 92, temperature 97.8 F (36.6 C), temperature source Oral, resp. rate 18, height  (1.702 m), weight 79.4 kg (175 lb).Body mass index is 27.41 kg/m.  General Appearance: improved grooming   Eye Contact::  Good  Speech:  Normal Rate409  Volume:  Normal  Mood:  reports mood is better, states he is less depressed, feeling " good"  Affect:  Appropriate and more reactive   Thought Process:  Linear and Descriptions of Associations: Intact  Orientation:  Full (Time, Place, and Person)  Thought Content:  no hallucinations, no delusions, not internally preoccupied   Suicidal Thoughts:  No denies any suicidal or self injurious ideations, denies any homicidal or violent ideations  Homicidal Thoughts:  No denies any homicidal or violent ideations, and specifically denies any HI or violent ideations towards ex fiance   Memory:  recent and remote grossly intact   Judgement:  Other:  improving   Insight:  improving   Psychomotor Activity:  Normal  Concentration:  Good  Recall:  Good  Fund of Knowledge:Good  Language: Good  Akathisia:  Negative  Handed:  Right  AIMS (if indicated):     Assets:  Communication Skills Desire for Improvement Physical Health Resilience  Sleep:  Number of Hours: 5.25  Cognition: WNL  ADL's:  Intact   Mental Status Per Nursing  Assessment::   On Admission:  NA  Demographic Factors:  26 year old male   Loss Factors: 26 year old male, single, currently unemployed , lives alone   Historical Factors: Prior psychiatric admissions , history of mood disorder- has been diagnosed with  Bipolar Disorder diagnosis , history of cannabis abuse, remote history of alcohol abuse, no prior suicide attempts   Risk Reduction Factors:   Responsible for children under 30 years of age, Sense of responsibility to family and Positive coping skills or problem solving skills  Continued Clinical Symptoms:  At this time patient is alert, attentive, well related, pleasant, mood improved, affect appropriate, more reactive, no thought disorder, no suicidal or self injurious ideations, no homicidal ideations, no hallucinations, no delusions, not internally preoccupied, future oriented . Denies medication side effects. Behavior on unit in good spirits .  Cognitive Features That Contribute To Risk:  No gross cognitive deficits noted upon discharge. Is alert , attentive, and oriented x 3     Suicide Risk:  Mild:  Suicidal ideation of limited frequency, intensity, duration, and specificity.  There are no identifiable plans, no associated intent, mild dysphoria and related symptoms, good self-control (both objective and subjective assessment), few other risk factors, and identifiable protective factors, including available and accessible social support.  Follow-up Information    [facility] Follow up.   Specialty:  Behavioral Health Why:  Please go for a walk-in appointment within 1-3 days of discharge to be established for outpatient services. Walk-in hours are Mon-Fri 8AM-3PM. Please arrive as early as possible to be sure that you are seen. Contact  information[address]pid[address] [place] [address] [phone]           Plan Of Care/Follow-up recommendations:  Activity:  as tolerated  Diet:  Regular Tests:  NA Other:  See Below    Patient is requesting discharge and there are no current grounds for involuntary commitment  He is leaving unit in good spirits  Plans to return home Plans to follow up as above   Craige Cotta, MD 08/04/2016, 9:09 AM

## 2016-08-04 NOTE — Progress Notes (Signed)
Recreation Therapy Notes  Date: 08/04/16 Time: 1000 Location: 300 Hall Dayroom  Group Topic: Wellness  Goal Area(s) Addresses:  Patient will define components of whole wellness. Patient will verbalize benefit of whole wellness.  Behavioral Response: Engaged  Intervention:  Chairs, Giant beach ball  Activity: Keep It Contractor.  Patients were placed in a circle.  Patients were to pass the beach ball back and forth to each other.  The ball could bounce off the ground but could not come to a complete stop.  LRT would keep count of how many times the ball was hit.  If the ball comes to a stop, the count starts over.  Education: Wellness, Building control surveyor.   Education Outcome: Acknowledges education/In group clarification offered/Needs additional education.   Clinical Observations/Feedback: Pt was very social with peers and engaged in group.  Pt was coming up with creative ways to hit the ball to rack up "points".  Pt was encouraging to peers and appropriate.   Caroll Rancher, LRT/CTRS         Caroll Rancher A 08/04/2016 11:56 AM

## 2016-08-04 NOTE — Plan of Care (Signed)
Problem: [facility] Participation in Recreation Therapeutic Interventions Goal: STG-Patient will identify at least five coping skills for ** STG: Coping Skills - Patient will be able to identify at least 5 coping skills for suicide attempt by conclusion of recreation therapy tx  Outcome: Completed/Met Date Met: 08/04/16 Pt was able to identify coping skills at completion of coping skills recreation therapy session.  Victorino Sparrow, LRT/CTRS

## 2016-08-04 NOTE — Progress Notes (Addendum)
  [facility] Adult Case Management Discharge Plan :  Will you be returning to the same living situation after discharge:  Yes,  home At discharge, do you have transportation home?: Yes,  friend Do you have the ability to pay for your medications: Yes,  mental health  Release of information consent forms completed and in the chart;  Patient's signature needed at discharge.  Patient to Follow up at: Follow-up Information    [facility] Follow up.   Specialty:  Behavioral Health Why:  Please go for a walk-in appointment within 1-3 days of discharge to be established for outpatient services. Walk-in hours are Mon-Fri 8AM-3PM. Please arrive as early as possible to be sure that you are seen. Contact information: [address] [place] [address] [phone]           Next level of care provider has access to [facility] Link:no  Safety Planning and Suicide Prevention discussed: Yes,  yes  Have you used any form of tobacco in the last 30 days? (Cigarettes, Smokeless Tobacco, Cigars, and/or Pipes): No  Has patient been referred to the Quitline?: N/A patient is not a smoker  Patient has been referred for addiction treatment: N/A  Daniel [name] 08/04/2016, 10:07 AM

## 2016-08-04 NOTE — Tx Team (Signed)
Interdisciplinary Treatment and Diagnostic Plan Update  08/04/2016 Time of Session: 10:02 AM  Kenji Mapel MRN: [id number]  Principal Diagnosis: Major depressive disorder, recurrent severe without psychotic features ([place])  Secondary Diagnoses: Principal Problem:   Major depressive disorder, recurrent severe without psychotic features ([place]) Active Problems:   Substance use disorder   Current Medications:  Current Facility-Administered Medications  Medication Dose Route Frequency Provider Last Rate Last Dose  . acetaminophen (TYLENOL) tablet 650 mg  650 mg Oral Q6H PRN Rozetta Nunnery, NP   650 mg at 08/03/16 0823  . albuterol (PROVENTIL HFA;VENTOLIN HFA) 108 (90 Base) MCG/ACT inhaler 1-2 puff  1-2 puff Inhalation Q6H PRN Rozetta Nunnery, NP      . alum & mag hydroxide-simeth (MAALOX/MYLANTA) 200-200-20 MG/5ML suspension 30 mL  30 mL Oral Q4H PRN Rozetta Nunnery, NP      . hydrOXYzine (ATARAX/VISTARIL) tablet 25 mg  25 mg Oral TID PRN Rozetta Nunnery, NP   25 mg at 08/03/16 2143  . magnesium hydroxide (MILK OF MAGNESIA) suspension 30 mL  30 mL Oral Daily PRN Rozetta Nunnery, NP      . sertraline (ZOLOFT) tablet 50 mg  50 mg Oral Daily Jenne Campus, MD   50 mg at 08/04/16 0758  . traZODone (DESYREL) tablet 50 mg  50 mg Oral QHS PRN Jenne Campus, MD   50 mg at 08/03/16 2143    PTA Medications: Prescriptions Prior to Admission  Medication Sig Dispense Refill Last Dose  . albuterol (PROVENTIL HFA;VENTOLIN HFA) 108 (90 BASE) MCG/ACT inhaler Inhale 1-2 puffs into the lungs every 6 (six) hours as needed for wheezing or shortness of breath.    Past Month at Unknown time  . ibuprofen (ADVIL,MOTRIN) 200 MG tablet Take 800 mg by mouth every 6 (six) hours as needed for fever, headache, mild pain or moderate pain.   07/29/2016 at 1300    Treatment Modalities: Medication Management, Group therapy, Case management,  1 to 1 session with clinician, Psychoeducation, Recreational therapy.   Physician  Treatment Plan for Primary Diagnosis: Major depressive disorder, recurrent severe without psychotic features ([place]) Long Term Goal(s): Improvement in symptoms so as ready for discharge  Short Term Goals: Ability to identify changes in lifestyle to reduce recurrence of condition will improve Ability to verbalize feelings will improve Ability to disclose and discuss suicidal ideas Ability to demonstrate self-control will improve Ability to identify and develop effective coping behaviors will improve Ability to maintain clinical measurements within normal limits will improve Compliance with prescribed medications will improve Ability to identify triggers associated with substance abuse/mental health issues will improve Ability to identify changes in lifestyle to reduce recurrence of condition will improve Ability to verbalize feelings will improve Ability to disclose and discuss suicidal ideas Ability to demonstrate self-control will improve Ability to identify and develop effective coping behaviors will improve Ability to maintain clinical measurements within normal limits will improve Compliance with prescribed medications will improve Ability to identify triggers associated with substance abuse/mental health issues will improve  Medication Management: Evaluate patient's response, side effects, and tolerance of medication regimen.  Therapeutic Interventions: 1 to 1 sessions, Unit Group sessions and Medication administration.  Evaluation of Outcomes: Adequate for Discharge  Physician Treatment Plan for Secondary Diagnosis: Principal Problem:   Major depressive disorder, recurrent severe without psychotic features ([place]) Active Problems:   Substance use disorder   Long Term Goal(s): Improvement in symptoms so as ready for discharge  Short Term Goals: Ability to  identify changes in lifestyle to reduce recurrence of condition will improve Ability to verbalize feelings will  improve Ability to disclose and discuss suicidal ideas Ability to demonstrate self-control will improve Ability to identify and develop effective coping behaviors will improve Ability to maintain clinical measurements within normal limits will improve Compliance with prescribed medications will improve Ability to identify triggers associated with substance abuse/mental health issues will improve Ability to identify changes in lifestyle to reduce recurrence of condition will improve Ability to verbalize feelings will improve Ability to disclose and discuss suicidal ideas Ability to demonstrate self-control will improve Ability to identify and develop effective coping behaviors will improve Ability to maintain clinical measurements within normal limits will improve Compliance with prescribed medications will improve Ability to identify triggers associated with substance abuse/mental health issues will improve  Medication Management: Evaluate patient's response, side effects, and tolerance of medication regimen.  Therapeutic Interventions: 1 to 1 sessions, Unit Group sessions and Medication administration.  Evaluation of Outcomes: Adequate for Discharge   RN Treatment Plan for Primary Diagnosis: Major depressive disorder, recurrent severe without psychotic features (HCC) Long Term Goal(s): Knowledge of disease and therapeutic regimen to maintain health will improve  Short Term Goals: Ability to verbalize feelings will improve and Ability to identify and develop effective coping behaviors will improve  Medication Management: RN will administer medications as ordered by provider, will assess and evaluate patient's response and provide education to patient for prescribed medication. RN will report any adverse and/or side effects to prescribing provider.  Therapeutic Interventions: 1 on 1 counseling sessions, Psychoeducation, Medication administration, Evaluate responses to treatment, Monitor  vital signs and CBGs as ordered, Perform/monitor CIWA, COWS, AIMS and Fall Risk screenings as ordered, Perform wound care treatments as ordered.  Evaluation of Outcomes: Adequate for Discharge   LCSW Treatment Plan for Primary Diagnosis: Major depressive disorder, recurrent severe without psychotic features (HCC) Long Term Goal(s): Safe transition to appropriate next level of care at discharge, Engage patient in therapeutic group addressing interpersonal concerns.  Short Term Goals: Engage patient in aftercare planning with referrals and resources  Therapeutic Interventions: Assess for all discharge needs, 1 to 1 time with Social worker, Explore available resources and support systems, Assess for adequacy in community support network, Educate family and significant other(s) on suicide prevention, Complete Psychosocial Assessment, Interpersonal group therapy.  Evaluation of Outcomes: Met  Return home, follow up [facility]   Progress in Treatment: Attending groups: Yes Participating in groups: Yes Taking medication as prescribed: Yes Toleration medication: Yes, no side effects reported at this time Family/Significant other contact made: No  Left message Patient understands diagnosis: Yes AEB asking for help with anxiety, depression, increased stress Discussing patient identified problems/goals with staff: Yes Medical problems stabilized or resolved: Yes Denies suicidal/homicidal ideation: Yes Issues/concerns per patient self-inventory: None Other: N/A  New problem(s) identified: None identified at this time.   New Short Term/Long Term Goal(s): None identified at this time.   Discharge Plan or Barriers:   Reason for Continuation of Hospitalization:   Estimated Length of Stay: D/C today  Attendees: Patient: 08/04/2016  10:02 AM  Physician: Fernando Cobos, MD 08/04/2016  10:02 AM  Nursing: Marion Friedman, RN 08/04/2016  10:02 AM  RN Care Manager: Jennifer Clark, RN 08/04/2016  10:02  AM  Social Worker: Rod  08/04/2016  10:02 AM  Recreational Therapist: Marjette  08/04/2016  10:02 AM  Other: Delora Sutton 08/04/2016  10:02 AM  Other:  08/04/2016  10:02 AM    Scribe for Treatment   Team:    LCSW 08/04/2016 10:02 AM   

## 2017-01-20 ENCOUNTER — Emergency Department (HOSPITAL_COMMUNITY)
Admission: EM | Admit: 2017-01-20 | Discharge: 2017-01-20 | Disposition: A | Discharge status: Home / Self Care | Payer: Self-pay | Attending: Emergency Medicine | Admitting: Emergency Medicine

## 2017-01-20 ENCOUNTER — Encounter (HOSPITAL_COMMUNITY): Payer: Self-pay | Admitting: *Deleted

## 2017-01-20 DIAGNOSIS — S90829A Blister (nonthermal), unspecified foot, initial encounter: Secondary | ICD-10-CM | POA: Insufficient documentation

## 2017-01-20 DIAGNOSIS — Y939 Activity, unspecified: Secondary | ICD-10-CM | POA: Insufficient documentation

## 2017-01-20 DIAGNOSIS — X58XXXA Exposure to other specified factors, initial encounter: Secondary | ICD-10-CM | POA: Insufficient documentation

## 2017-01-20 DIAGNOSIS — Y929 Unspecified place or not applicable: Secondary | ICD-10-CM | POA: Insufficient documentation

## 2017-01-20 DIAGNOSIS — J45909 Unspecified asthma, uncomplicated: Secondary | ICD-10-CM | POA: Insufficient documentation

## 2017-01-20 DIAGNOSIS — Y999 Unspecified external cause status: Secondary | ICD-10-CM | POA: Insufficient documentation

## 2017-01-20 DIAGNOSIS — Z79899 Other long term (current) drug therapy: Secondary | ICD-10-CM | POA: Insufficient documentation

## 2017-01-20 DIAGNOSIS — R238 Other skin changes: Secondary | ICD-10-CM

## 2017-01-20 DIAGNOSIS — Z590 Homelessness: Secondary | ICD-10-CM | POA: Insufficient documentation

## 2017-01-20 NOTE — ED Triage Notes (Signed)
Patient presents with c/o blisters to the bottom of his feet.  Also stated his athletes feet from before has not cleared up  Also states he is homeless

## 2017-01-20 NOTE — ED Provider Notes (Signed)
[facility]-EMERGENCY DEPT Provider Note   CSN: [id number] Arrival date & time: 01/20/17  0116     History   Chief Complaint Chief Complaint  Patient presents with  . Blisters on Feet    HPI Daniel [name] is a 26 y.o. male.  HPI Reports blisters on his feet.  No fevers. No other complaints. Homeless.    Past Medical History:  Diagnosis Date  . ADHD (attention deficit hyperactivity disorder)   . Alcohol consumption binge drinking   . Asthma   . Bipolar disorder [facility])     Patient Active Problem List   Diagnosis Date Noted  . 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 Substance use disorder 08/01/2016    Past Surgical History:  Procedure Laterality Date  . 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 Authorizing Provider  albuterol (PROVENTIL HFA;VENTOLIN HFA) 108 (90 Base) MCG/ACT inhaler Inhale 1-2 puffs into the lungs every 6 (six) hours as needed for wheezing or shortness of breath. 08/04/16   Armandina Stammer I, NP  hydrOXYzine (ATARAX/VISTARIL) 25 MG tablet Take 1 tablet (25 mg total) by mouth 3 (three) times daily as needed for anxiety. 08/04/16   Armandina Stammer I, NP  sertraline (ZOLOFT) 50 MG tablet Take 1 tablet (50 mg total) by mouth daily. For depression 08/05/16   Armandina Stammer I, NP  traZODone (DESYREL) 50 MG tablet Take 1 tablet (50 mg total) by mouth at bedtime as needed for sleep. 08/04/16   Sanjuana Kava, NP    Family History Family History  Problem Relation Age of Onset  . Breast cancer Mother   . Depression Mother   . Drug abuse Mother   . ADD / ADHD Sister   . Bipolar disorder Sister   . Drug abuse Sister   . Suicidality Cousin     Social History Social History  Substance Use Topics  . Smoking status: Never Smoker  . Smokeless tobacco: Never Used  . Alcohol use No     Allergies   Iodine and Shellfish  allergy   Review of Systems Review of Systems  All other systems reviewed and are negative.    Physical Exam Updated Vital Signs Ht  (1.702 m)   Wt 72.9 kg (160 lb 11.2 oz)   BMI 25.17 kg/m   Physical Exam  Constitutional: He is oriented to person, place, and time. He appears well-developed and well-nourished.  HENT:  Head: Normocephalic.  Eyes: EOM are normal.  Neck: Normal range of motion.  Pulmonary/Chest: Effort normal.  Abdominal: He exhibits no distension.  Musculoskeletal: Normal range of motion.  Blisters without secondary signs of iinfection on both feet  Neurological: He is alert and oriented to person, place, and time.  Psychiatric: He has a normal mood and affect.  Nursing note and vitals reviewed.    ED Treatments / Results  Labs (all labs ordered are listed, but only abnormal results are displayed) Labs Reviewed - No data to display  EKG  EKG Interpretation None       Radiology No results found.  Procedures Procedures (including critical care time)  Medications Ordered in ED Medications - No data to display   Initial Impression / Assessment and Plan / ED Course  I have reviewed the triage vital signs and the nursing notes.  Pertinent labs & imaging results that  were available during my care of the patient were reviewed by me and considered in my medical decision making (see chart for details).      Final Clinical Impressions(s) / ED Diagnoses   Final diagnoses:  Blisters of multiple sites    New Prescriptions New Prescriptions   No medications on file     Azalia Bilis, MD 01/20/17 [phone]

## 2017-01-20 NOTE — ED Notes (Signed)
Discharge instructions reviewed - instructed to go to the pharmacy and get medicine for his athletes feet

## 2017-01-20 NOTE — ED Notes (Signed)
Dr Patria Mane seeing patient in triage

## 2017-07-02 ENCOUNTER — Encounter (HOSPITAL_COMMUNITY): Payer: Self-pay | Admitting: Emergency Medicine

## 2017-07-02 ENCOUNTER — Emergency Department (HOSPITAL_COMMUNITY)
Admission: EM | Admit: 2017-07-02 | Discharge: 2017-07-02 | Disposition: A | Discharge status: Home / Self Care | Payer: Self-pay | Attending: Emergency Medicine | Admitting: Emergency Medicine

## 2017-07-02 DIAGNOSIS — Z79899 Other long term (current) drug therapy: Secondary | ICD-10-CM | POA: Insufficient documentation

## 2017-07-02 DIAGNOSIS — M545 Low back pain: Secondary | ICD-10-CM | POA: Insufficient documentation

## 2017-07-02 DIAGNOSIS — J45909 Unspecified asthma, uncomplicated: Secondary | ICD-10-CM | POA: Insufficient documentation

## 2017-07-02 MED ORDER — ACETAMINOPHEN 325 MG PO TABS: 650.0000 mg | ORAL_TABLET | Freq: Four times a day (QID) | ORAL | 0 refills | Status: DC | PRN

## 2017-07-02 MED ORDER — IBUPROFEN 400 MG PO TABS: 400.0000 mg | ORAL_TABLET | Freq: Four times a day (QID) | ORAL | 0 refills | Status: DC | PRN

## 2017-07-02 NOTE — Discharge Instructions (Signed)
You may alternate taking Tylenol and Ibuprofen as needed for pain control. You may take 400-600 mg of ibuprofen every 6 hours and 500-1000 mg of Tylenol every 6 hours. Do not exceed 4000 mg of Tylenol daily as this can lead to liver damage. Also, make sure to take Ibuprofen with meals as it can cause an upset stomach. Do not take other NSAIDs while taking Ibuprofen such as (Aleve, Naprosyn, Aspirin, Celebrex, etc) and do not take more than the prescribed dose as this can lead to ulcers and bleeding in your GI tract. You may use warm and cold compresses to help with your symptoms.  ° °Please follow up with your primary doctor within the next 7-10 days for re-evaluation and further treatment of your symptoms.  ° °Return to the emergency department immediately if you experience any back pain associated with fevers, loss of control of your bowels/bladder, weakness/numbness to your legs, numbness to your groin area, inability to walk, or inability to urinate.  ° ° °

## 2017-07-02 NOTE — ED Provider Notes (Signed)
[facility]-EMERGENCY DEPT Provider Note   CSN: [id number] Arrival date & time: 07/02/17  0946     History   Chief Complaint Chief Complaint  Patient presents with  . Back Pain    HPI Daniel [name] is a 27 y.o. male.  HPI   Pt is a 27 y/o male who presents to the ED today for evaluation of sudden onset lower back pain that began yesterday afternoon around 430pm. States he bent down to lift some heavy bags and when he stood up he experienced pain to his bilat lower back. Initially pain was 10/10, has since improved to 6/10 when not moving. Worse with movement. Reports intermittent paresthesias to BLE. Reports the LLE intermittently gives out on him. Pain is constant. Has not taken any medication for the pain.  States he has been ambulatory since this happened and [place].  Pt denies any persistent numbness/weakness to the BLE. Denies saddle anesthesia. Denies loss of control of bowels or bladder. No urinary retention. No fevers. Denies a h/o IVDU. Denies a h/o CA or recent unintended weight loss.  Past Medical History:  Diagnosis Date  . ADHD (attention deficit hyperactivity disorder)   . Alcohol consumption binge drinking   . Asthma   . Bipolar disorder [facility](HCC)     Patient Active Problem List   Diagnosis Date Noted  . 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 Substance use disorder 08/01/2016    Past Surgical History:  Procedure Laterality Date  . 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 Authorizing Provider  acetaminophen (TYLENOL) 325 MG tablet Take 2 tablets (650 mg total) by mouth every 6 (six) hours as needed. Do not take more than 4000mg  of tylenol per day 07/02/17   Janellie Tennison S, PA-C  albuterol (PROVENTIL HFA;VENTOLIN HFA) 108 (90 Base) MCG/ACT inhaler Inhale  1-2 puffs into the lungs every 6 (six) hours as needed for wheezing or shortness of breath. 08/04/16   Armandina StammerNwoko, Agnes I, NP  hydrOXYzine (ATARAX/VISTARIL) 25 MG tablet Take 1 tablet (25 mg total) by mouth 3 (three) times daily as needed for anxiety. 08/04/16   Armandina StammerNwoko, Agnes I, NP  ibuprofen (ADVIL,MOTRIN) 400 MG tablet Take 1 tablet (400 mg total) by mouth every 6 (six) hours as needed. 07/02/17   Ladonya Jerkins S, PA-C  sertraline (ZOLOFT) 50 MG tablet Take 1 tablet (50 mg total) by mouth daily. For depression 08/05/16   Armandina StammerNwoko, Agnes I, NP  traZODone (DESYREL) 50 MG tablet Take 1 tablet (50 mg total) by mouth at bedtime as needed for sleep. 08/04/16   Sanjuana KavaNwoko, Agnes I, NP    Family History Family History  Problem Relation Age of Onset  . Breast cancer Mother   . Depression Mother   . Drug abuse Mother   . ADD / ADHD Sister   . Bipolar disorder Sister   . Drug abuse Sister   . Suicidality Cousin     Social History Social History   Tobacco Use  . Smoking status: Never Smoker  . Smokeless tobacco: Never Used  Substance Use Topics  . Alcohol use: No  . Drug use: Yes    Types: Marijuana     Allergies   Iodine and Shellfish allergy   Review of Systems Review of Systems  Constitutional: Negative for  fever and unexpected weight change.  Respiratory: Negative for shortness of breath.   Cardiovascular: Negative for chest pain.  Gastrointestinal: Negative for abdominal pain, constipation, diarrhea, nausea and vomiting.       No bowel incontinence  Genitourinary: Negative for decreased urine volume and dysuria.       No urinary incontinence or retention  Musculoskeletal: Positive for back pain. Negative for gait problem.  Neurological: Negative for weakness and numbness.     Physical Exam Updated Vital Signs BP 126/76 (BP Location: Left Arm)   Pulse 81   Temp 98.8 F (37.1 C) (Oral)   Resp 18   SpO2 95%   Physical Exam  Constitutional: He appears well-developed and  well-nourished.  HENT:  Head: Normocephalic and atraumatic.  Eyes: Conjunctivae are normal.  Neck: Normal range of motion. Neck supple.  Cardiovascular: Normal rate and regular rhythm.  No murmur heard. Pulmonary/Chest: Effort normal and breath sounds normal. No respiratory distress.  Abdominal: Soft. There is no tenderness.  Musculoskeletal: He exhibits no edema.  Midline lumbar ttp with associated paraspinous ttp bilat. No thoracic or cervical midline ttp. FROM of the back  Neurological: He is alert.  Motor:  Normal tone. 5/5 strength of BUE and BLE major muscle groups including strong and equal grip strength and dorsiflexion/plantar flexion Sensory: light touch normal in all extremities. DTRs: patellar 2+ symmetric b/l Gait: normal gait and balance. Able to walk on toes and heels with ease.  CV: 2+ radial and DP/PT pulses   Skin: Skin is warm and dry.  Psychiatric: He has a normal mood and affect.  Nursing note and vitals reviewed.    ED Treatments / Results  Labs (all labs ordered are listed, but only abnormal results are displayed) Labs Reviewed - No data to display  EKG  EKG Interpretation None       Radiology No results found.  Procedures Procedures (including critical care time)  Medications Ordered in ED Medications - No data to display   Initial Impression / Assessment and Plan / ED Course  I have reviewed the triage vital signs and the nursing notes.  Pertinent labs & imaging results that were available during my care of the patient were reviewed by me and considered in my medical decision making (see chart for details).    Final Clinical Impressions(s) / ED Diagnoses   Final diagnoses:  Acute bilateral low back pain, with sciatica presence unspecified   Patient with back pain.  No neurological deficits and normal neuro exam.  Patient can walk but states is painful.  No loss of bowel or bladder control.  No concern for cauda equina.  No fever,  night sweats, weight loss, h/o cancer, IVDU.  RICE protocol and pain medicine indicated and discussed with patient.    ED Discharge Orders        Ordered    acetaminophen (TYLENOL) 325 MG tablet  Every 6 hours PRN     07/02/17 1246    ibuprofen (ADVIL,MOTRIN) 400 MG tablet  Every 6 hours PRN     07/02/17 1246       Arlinda Barcelona S, PA-C 07/02/17 1247    Derwood Kaplan, MD 07/02/17 1737

## 2017-07-02 NOTE — ED Triage Notes (Signed)
Patient c/o lower back pain after heavy lifting yesterday. Denies urinary sx. Reports pain worsens with movement. Ambulatory.

## 2017-07-02 NOTE — ED Notes (Signed)
Bed: WTR7 Expected date:  Expected time:  Means of arrival:  Comments: 

## 2017-09-19 IMAGING — CR DG CHEST 2V
2 series · 2 of 2 positions shown · non-contrast
Comparison: March 22, 2006.

CLINICAL DATA: Cough for 2 weeks.  Hemoptysis.

EXAM:
CHEST  2 VIEW

[w chest pa]
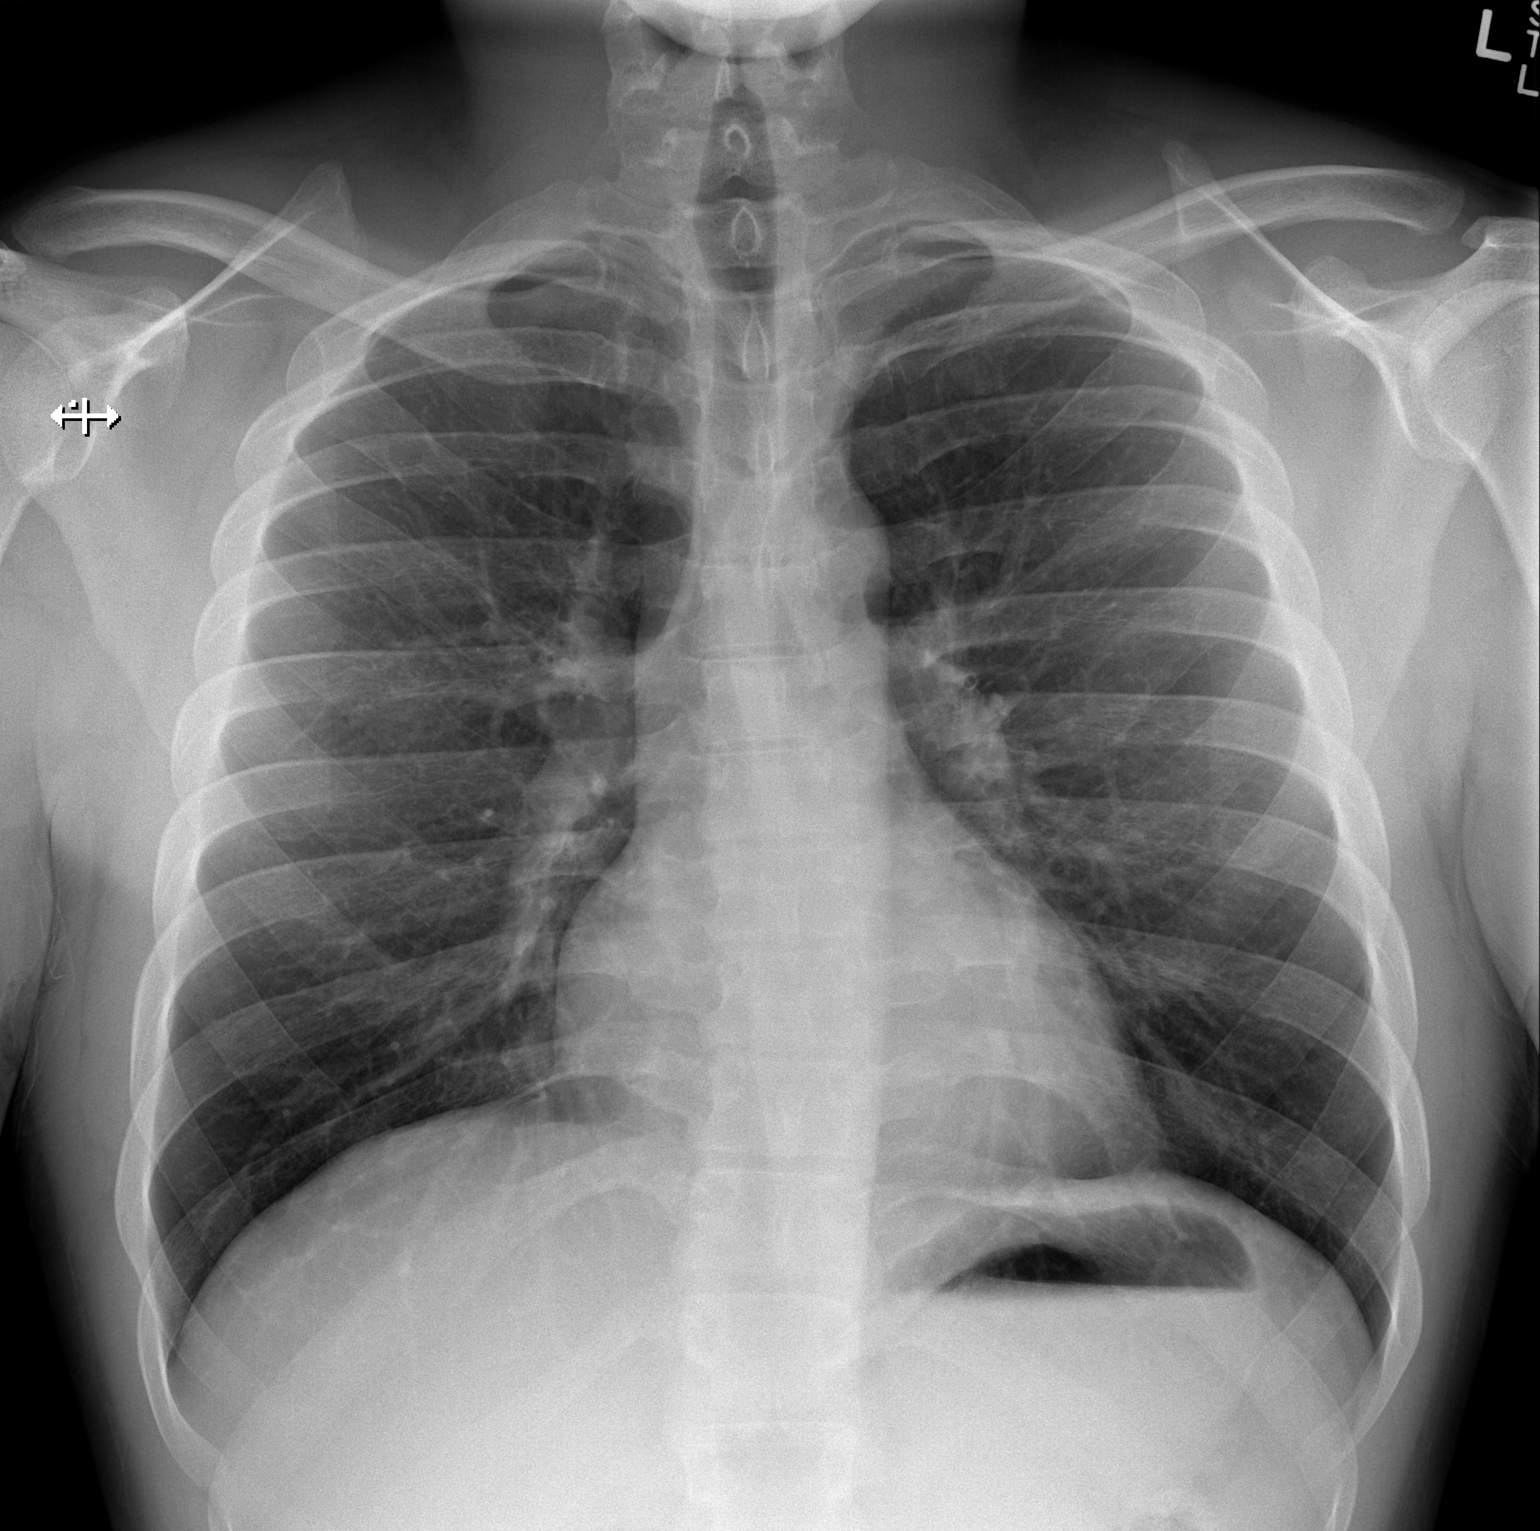

[w chest lat]
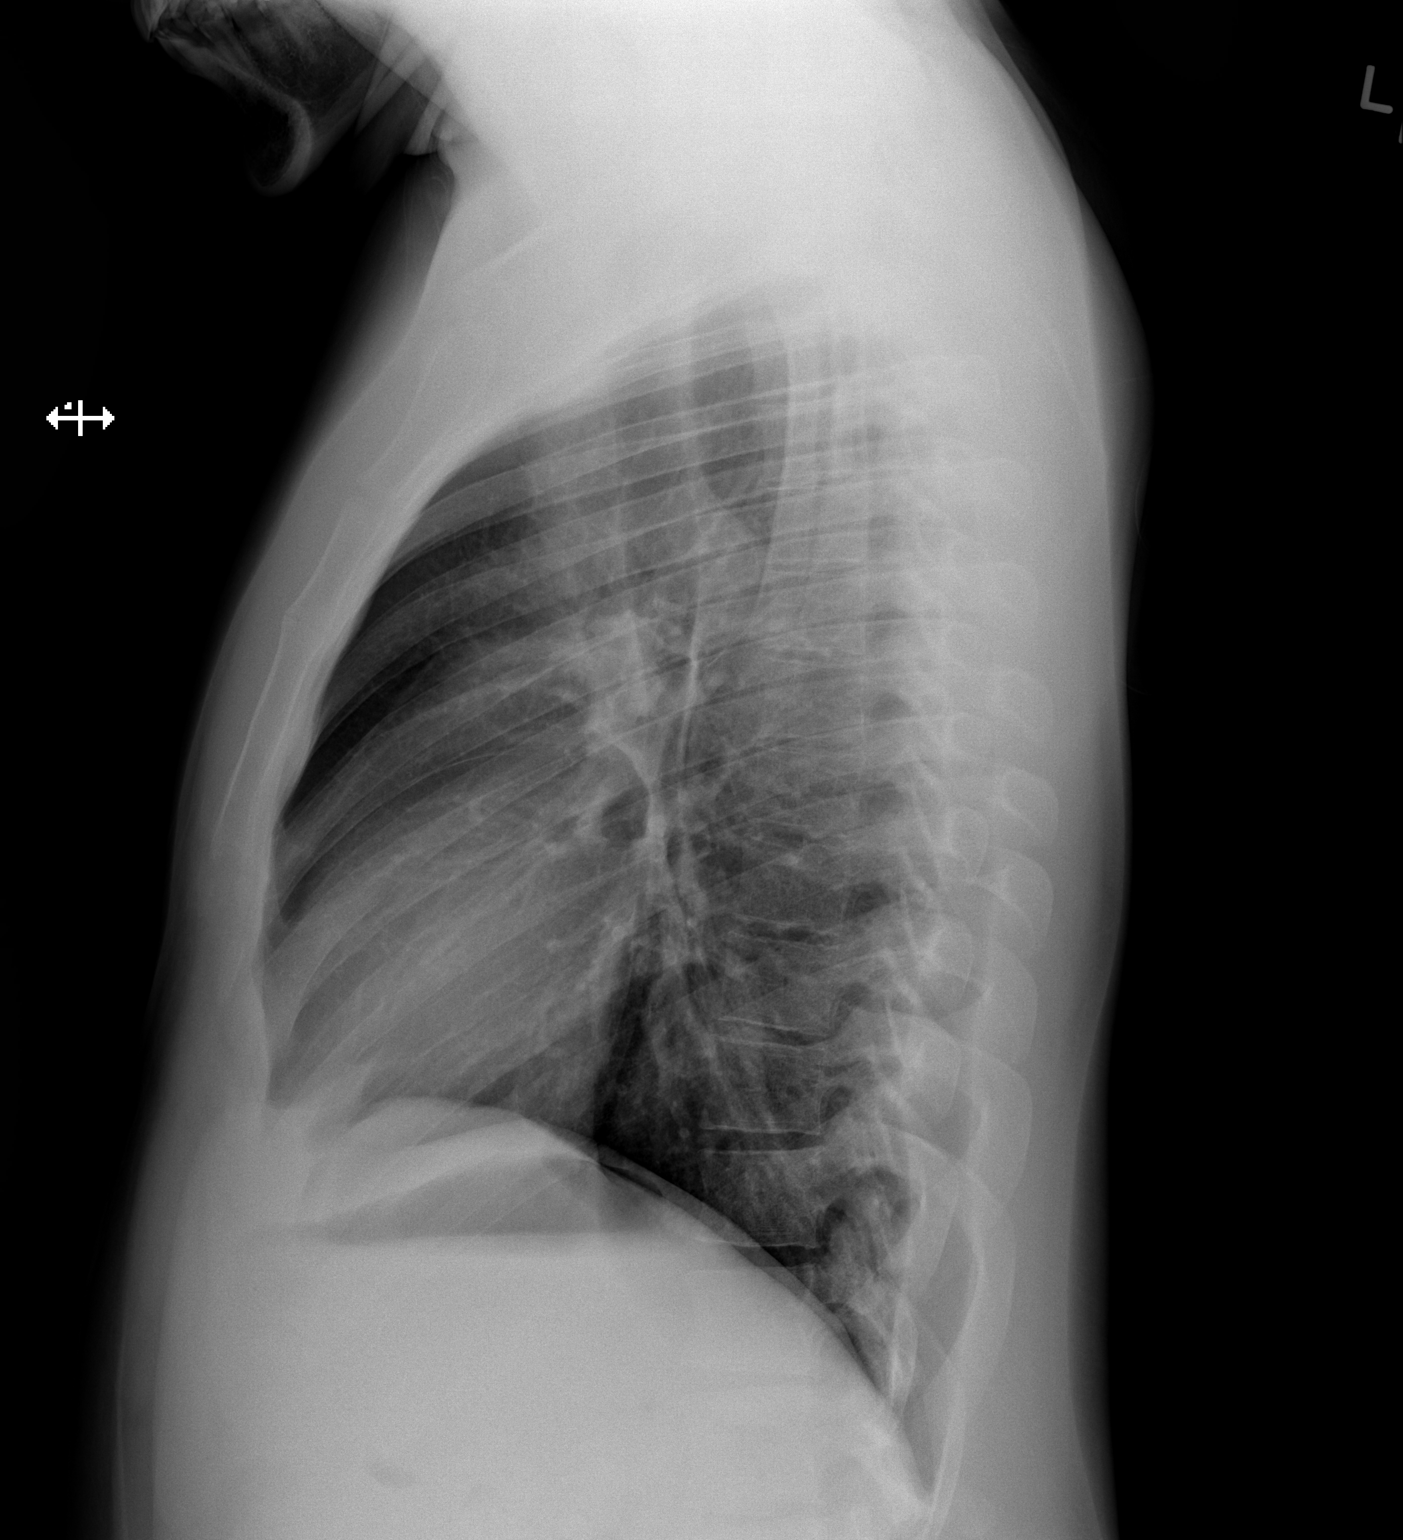

[2 of 2 positions shown; findings below may reference images not displayed]

FINDINGS: The heart size and mediastinal contours are within normal limits.
Both lungs are clear. No pneumothorax or pleural effusion is noted.
The visualized skeletal structures are unremarkable.
IMPRESSION: No active cardiopulmonary disease.

## 2018-03-11 ENCOUNTER — Encounter (HOSPITAL_COMMUNITY): Payer: Self-pay | Admitting: Emergency Medicine

## 2018-03-11 ENCOUNTER — Other Ambulatory Visit: Payer: Self-pay

## 2018-03-11 ENCOUNTER — Emergency Department (HOSPITAL_COMMUNITY)
Admission: EM | Admit: 2018-03-11 | Discharge: 2018-03-11 | Disposition: A | Discharge status: Home / Self Care | Payer: Self-pay | Attending: Emergency Medicine | Admitting: Emergency Medicine

## 2018-03-11 DIAGNOSIS — L03312 Cellulitis of back [any part except buttock]: Secondary | ICD-10-CM | POA: Insufficient documentation

## 2018-03-11 DIAGNOSIS — J45909 Unspecified asthma, uncomplicated: Secondary | ICD-10-CM | POA: Insufficient documentation

## 2018-03-11 MED ORDER — CEPHALEXIN 500 MG PO CAPS: 500.0000 mg | ORAL_CAPSULE | Freq: Four times a day (QID) | ORAL | 0 refills | Status: AC

## 2018-03-11 MED ORDER — CEPHALEXIN 500 MG PO CAPS
500.0000 mg | ORAL_CAPSULE | Freq: Once | ORAL | Status: AC
  Administered 2018-03-11: 500 mg via ORAL
  Filled 2018-03-11: qty 1

## 2018-03-11 NOTE — ED Triage Notes (Signed)
Patient reports wound to right upper back since Thursday. Reports potential insect bite or abscess. Patient is homeless. Denies fevers.

## 2018-03-11 NOTE — ED Provider Notes (Signed)
[facility]-EMERGENCY DEPT Provider Note   CSN: [id number] Arrival date & time: 03/11/18  2144     History   Chief Complaint Chief Complaint  Patient presents with  . Abscess    HPI Daniel [name] is a 27 y.o. male.  The history is provided by the patient, a relative and medical records. No language interpreter was used.  Rash   This is a new problem. The current episode started more than 2 days ago. The problem has been gradually worsening. The problem is associated with nothing. There has been no fever. The rash is present on the back. The pain is moderate. The pain has been constant since onset. Associated symptoms include pain. He has tried nothing for the symptoms. The treatment provided no relief.    Past Medical History:  Diagnosis Date  . ADHD (attention deficit hyperactivity disorder)   . Alcohol consumption binge drinking   . Asthma   . Bipolar disorder [facility])     Patient Active Problem List   Diagnosis Date Noted  . 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 Substance use disorder 08/01/2016    Past Surgical History:  Procedure Laterality Date  . 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 Authorizing Provider  acetaminophen (TYLENOL) 325 MG tablet Take 2 tablets (650 mg total) by mouth every 6 (six) hours as needed. Do not take more than 4000mg  of tylenol per day 07/02/17   Couture, Cortni S, PA-C  albuterol (PROVENTIL HFA;VENTOLIN HFA) 108 (90 Base) MCG/ACT inhaler Inhale 1-2 puffs into the lungs every 6 (six) hours as needed for wheezing or shortness of breath. 08/04/16   Armandina Stammer I, NP  hydrOXYzine (ATARAX/VISTARIL) 25 MG tablet Take 1 tablet (25 mg total) by mouth 3 (three) times daily as needed for anxiety. 08/04/16   Armandina Stammer I, NP  ibuprofen (ADVIL,MOTRIN) 400 MG tablet Take 1 tablet  (400 mg total) by mouth every 6 (six) hours as needed. 07/02/17   Couture, Cortni S, PA-C  sertraline (ZOLOFT) 50 MG tablet Take 1 tablet (50 mg total) by mouth daily. For depression 08/05/16   Armandina Stammer I, NP  traZODone (DESYREL) 50 MG tablet Take 1 tablet (50 mg total) by mouth at bedtime as needed for sleep. 08/04/16   Sanjuana Kava, NP    Family History Family History  Problem Relation Age of Onset  . Breast cancer Mother   . Depression Mother   . Drug abuse Mother   . ADD / ADHD Sister   . Bipolar disorder Sister   . Drug abuse Sister   . Suicidality Cousin     Social History Social History   Tobacco Use  . Smoking status: Never Smoker  . Smokeless tobacco: Never Used  Substance Use Topics  . Alcohol use: No  . Drug use: Yes    Types: Marijuana     Allergies   Iodine and Shellfish allergy   Review of Systems Review of Systems  Constitutional: Negative for chills, fatigue and fever.  HENT: Negative for congestion and rhinorrhea.   Respiratory: Negative for apnea, cough, shortness of breath and wheezing.   Cardiovascular: Negative for chest pain, palpitations and leg swelling.  Gastrointestinal: Negative for abdominal pain, constipation, diarrhea, nausea and vomiting.  Genitourinary: Negative for dysuria and flank pain.  Musculoskeletal: Positive for  back pain. Negative for neck pain and neck stiffness.  Skin: Positive for rash. Negative for wound.  Neurological: Negative for light-headedness and headaches.  Psychiatric/Behavioral: Negative for agitation.  All other systems reviewed and are negative.    Physical Exam Updated Vital Signs BP 124/77 (BP Location: Left Arm)   Pulse 88   Temp 99 F (37.2 C) (Oral)   Resp 16   SpO2 100%   Physical Exam  Constitutional: He is oriented to person, place, and time. He appears well-developed and well-nourished. No distress.  HENT:  Head: Normocephalic and atraumatic.  Mouth/Throat: Oropharynx is clear and  moist.  Eyes: Pupils are equal, round, and reactive to light. Conjunctivae are normal.  Neck: Neck supple.  Cardiovascular: Normal rate and regular rhythm.  No murmur heard. Pulmonary/Chest: Effort normal and breath sounds normal. No respiratory distress. He has no wheezes. He has no rales. He exhibits no tenderness.  Abdominal: Soft. There is no tenderness.  Musculoskeletal: He exhibits tenderness. He exhibits no edema.       Thoracic back: He exhibits tenderness.       Back:  Neurological: He is alert and oriented to person, place, and time. No sensory deficit. He exhibits normal muscle tone.  Skin: Skin is warm and dry. Capillary refill takes less than 2 seconds. Rash noted. He is not diaphoretic. There is erythema. No pallor.  Psychiatric: He has a normal mood and affect.  Nursing note and vitals reviewed.    ED Treatments / Results  Labs (all labs ordered are listed, but only abnormal results are displayed) Labs Reviewed - No data to display  EKG None  Radiology No results found.  Procedures Procedures (including critical care time)  EMERGENCY DEPARTMENT US SOFT TISSUE INTERPRETATION "Study: Limited Soft Tissue Ultrasound"  INDICATIONS: Soft tissue infection Multiple views of the body part were obtained in real-time with a multi-frequency linear probe  PERFORMED BY: Myself IMAGES ARCHIVED?: Yes SIDE:Right  BODY PART:Upper back INTERPRETATION:  No abcess noted and Cellulitis present    Medications Ordered in ED Medications  cephALEXin (KEFLEX) capsule 500 mg (500 mg Oral Given 03/11/18 2316)     Initial Impression / Assessment and Plan / ED Course  I have reviewed the triage vital signs and the nursing notes.  Pertinent labs & imaging results that were available during my care of the patient were reviewed by me and considered in my medical decision making (see chart for details).     Daniel [name] is a 27 y.o. male with a past medical history significant  bipolar disorder, asthma, and substance abuse who presents with infection.  Patient reports that for the last 4 days he has had a bug bite/rash on his back he is concerned about.  He reports that he has had boils in the past but has never had one on his back.  He reports no fevers, chills, chest, shortness breath, nausea vomiting, or any other symptoms.  He reports that his right upper back has mild pain.  He reports that he has been draining but he thinks it was just a pimple or bug bite.  He denies any recent antibiotics or other complaints.  On exam, patient has a 4 similar area of cellulitis on his right upper back.  There is no fluctuance or crepitance.  There is some induration.  Ultrasound was performed at the bedside showing no evidence of abscess.  Soft tissue cellulitis was observed.  No drainage was appreciated.  No pocket able to be  drained seen.  Lungs clear chest nontender.  Exam otherwise unremarkable.    Patient given a dose of Keflex and will be given prescription for Keflex for cellulitis.  Patient will follow-up with a PCP and understood return precautions.  Patient otherwise or concerns and was discharged in good condition.           Final Clinical Impressions(s) / ED Diagnoses   Final diagnoses:  Cellulitis of back except buttock    ED Discharge Orders         Ordered    cephALEXin (KEFLEX) 500 MG capsule  4 times daily     03/11/18 2247         Clinical Impression: 1. Cellulitis of back except buttock     Disposition: Discharge  Condition: Good  I have discussed the results, Dx and Tx plan with the pt(& family if present). He/she/they expressed understanding and agree(s) with the plan. Discharge instructions discussed at great length. Strict return precautions discussed and pt &/or family have verbalized understanding of the instructions. No further questions at time of discharge.    New Prescriptions   CEPHALEXIN (KEFLEX) 500 MG CAPSULE    Take 1 capsule (500  mg total) by mouth 4 (four) times daily for 7 days.    Follow Up: Lavinia SharpsPlacey, Mary Ann, NP [address] [place] [address] [phone]     [facility] [address] [id number] [place] [address] [place] [address] [id number] [phone]       Chizara Mena, Canary Brimhristopher J, MD 03/11/18 [phone]

## 2018-03-11 NOTE — Discharge Instructions (Signed)
Your exam today was consistent with a cellulitis on her back.  We used the ultrasound machine did not find evidence of an abscess or pocket of infection that required drainage.  Please take the antibiotics for the next week and follow-up with your primary doctor.  If any symptoms change or worsen including new fevers, chills, or spreading redness, please return to the nearest emergency department for evaluation.

## 2018-03-14 ENCOUNTER — Emergency Department (HOSPITAL_COMMUNITY)
Admission: EM | Admit: 2018-03-14 | Discharge: 2018-03-14 | Disposition: A | Discharge status: Home / Self Care | Payer: Self-pay | Attending: Emergency Medicine | Admitting: Emergency Medicine

## 2018-03-14 ENCOUNTER — Encounter (HOSPITAL_COMMUNITY): Payer: Self-pay | Admitting: Obstetrics and Gynecology

## 2018-03-14 ENCOUNTER — Other Ambulatory Visit: Payer: Self-pay

## 2018-03-14 DIAGNOSIS — J45909 Unspecified asthma, uncomplicated: Secondary | ICD-10-CM | POA: Insufficient documentation

## 2018-03-14 DIAGNOSIS — L089 Local infection of the skin and subcutaneous tissue, unspecified: Secondary | ICD-10-CM

## 2018-03-14 DIAGNOSIS — Z79899 Other long term (current) drug therapy: Secondary | ICD-10-CM | POA: Insufficient documentation

## 2018-03-14 DIAGNOSIS — L723 Sebaceous cyst: Secondary | ICD-10-CM | POA: Insufficient documentation

## 2018-03-14 MED ORDER — LIDOCAINE HCL 2 % IJ SOLN
INTRAMUSCULAR | Status: AC
  Filled 2018-03-14: qty 20

## 2018-03-14 MED ORDER — LIDOCAINE HCL 2 % IJ SOLN
10.0000 mL | Freq: Once | INTRAMUSCULAR | Status: AC
  Administered 2018-03-14: 18:00:00 via INTRADERMAL

## 2018-03-14 MED ORDER — SULFAMETHOXAZOLE-TRIMETHOPRIM 800-160 MG PO TABS
1.0000 | ORAL_TABLET | Freq: Once | ORAL | Status: DC
  Administered 2018-03-14: 1 via ORAL
  Filled 2018-03-14: qty 1

## 2018-03-14 MED ORDER — SULFAMETHOXAZOLE-TRIMETHOPRIM 800-160 MG PO TABS: 1.0000 | ORAL_TABLET | Freq: Two times a day (BID) | ORAL | 0 refills | Status: AC

## 2018-03-14 MED ORDER — LIDOCAINE HCL (CARDIAC) PF 100 MG/5ML IV SOSY: 10.0000 mL | PREFILLED_SYRINGE | Freq: Once | INTRAVENOUS | Status: DC

## 2018-03-14 MED ORDER — LIDOCAINE-EPINEPHRINE 2 %-1:100000 IJ SOLN
20.0000 mL | Freq: Once | INTRAMUSCULAR | Status: DC
  Filled 2018-03-14: qty 20

## 2018-03-14 MED ORDER — ACETAMINOPHEN 500 MG PO TABS
1000.0000 mg | ORAL_TABLET | Freq: Once | ORAL | Status: AC
  Administered 2018-03-14: 1000 mg via ORAL
  Filled 2018-03-14: qty 2

## 2018-03-14 NOTE — ED Triage Notes (Signed)
Patient reports he was recently seen for cellulitis. Pt has an area on his back that is currently draining. Pt reports he started taking his antibiotic on Monday.

## 2018-03-14 NOTE — Discharge Instructions (Signed)
You can take Tylenol or Ibuprofen as directed for pain. You can alternate Tylenol and Ibuprofen every 4 hours. If you take Tylenol at 1pm, then you can take Ibuprofen at 5pm. Then you can take Tylenol again at 9pm.   Continue taking the Keflex you were prescribed earlier.  Add the Bactrim.'  Apply warm compresses to the area or soak the area in warm water to help continue express drainage.   Keep the wound clean and dry. Gently wash the wound with soap and water and make sure to pat it dry.   Return to the Emergency Dept  in 2 days for wound recheck and/or packing removal.   Return to the Emergency Department if you experienced any worsening/spreading redness or swelling, fever, worsening pain, or any other worsening or concerning symptoms.

## 2018-03-14 NOTE — ED Notes (Signed)
Discharge instructions reviewed with patient. Patient verbalizes understanding. VSS.   

## 2018-03-14 NOTE — ED Provider Notes (Signed)
[name] Provider Note   CSN: [id number] Arrival date & time: 03/14/18  1546     History   Chief Complaint Chief Complaint  Patient presents with  . Abscess    HPI Daniel [name] is a 27 y.o. male past medical history of ADHD, asthma, bipolar, substance abuse who presents for evaluation of pain, redness, swelling to his back that is been ongoing for last several days.  He was seen in the ED on 03/11/18 for evaluation of redness, swelling to back.  At that time, he had an ultrasound that showed no evidence of fluid collection.  He was discharged home with Keflex to treat cellulitis.  He reports he has been taking antibiotics.  He states that since then, the area has become more painful and raised up.  He has noticed drainage from the area.  He reports he has had some subjective chills at home but has not measured any documented fever.  Patient has been able to walk without any difficulty.  He denies any numbness/weakness of his arms or legs.  Patient has a history of substance abuse but denies any IV drug use.  The history is provided by the patient.    Past Medical History:  Diagnosis Date  . ADHD (attention deficit hyperactivity disorder)   . Alcohol consumption binge drinking   . Asthma   . Bipolar disorder [facility])     Patient Active Problem List   Diagnosis Date Noted  . 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 Substance use disorder 08/01/2016    Past Surgical History:  Procedure Laterality Date  . 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 Authorizing Provider  acetaminophen (TYLENOL) 325 MG tablet Take 2 tablets (650 mg total) by mouth every 6 (six) hours as needed. Do not take more than 4000mg  of tylenol per day 07/02/17   Couture, Cortni S, PA-C  albuterol (PROVENTIL HFA;VENTOLIN HFA)  108 (90 Base) MCG/ACT inhaler Inhale 1-2 puffs into the lungs every 6 (six) hours as needed for wheezing or shortness of breath. 08/04/16   Armandina Stammer I, NP  cephALEXin (KEFLEX) 500 MG capsule Take 1 capsule (500 mg total) by mouth 4 (four) times daily for 7 days. 03/11/18 03/18/18  Tegeler, Canary Brim, MD  hydrOXYzine (ATARAX/VISTARIL) 25 MG tablet Take 1 tablet (25 mg total) by mouth 3 (three) times daily as needed for anxiety. 08/04/16   Armandina Stammer I, NP  ibuprofen (ADVIL,MOTRIN) 400 MG tablet Take 1 tablet (400 mg total) by mouth every 6 (six) hours as needed. 07/02/17   Couture, Cortni S, PA-C  sertraline (ZOLOFT) 50 MG tablet Take 1 tablet (50 mg total) by mouth daily. For depression 08/05/16   Armandina Stammer I, NP  sulfamethoxazole-trimethoprim (BACTRIM DS,SEPTRA DS) 800-160 MG tablet Take 1 tablet by mouth 2 (two) times daily for 7 days. 03/14/18 03/21/18  Maxwell Caul, PA-C  traZODone (DESYREL) 50 MG tablet Take 1 tablet (50 mg total) by mouth at bedtime as needed for sleep. 08/04/16   Sanjuana Kava, NP    Family History Family History  Problem Relation Age of Onset  . Breast cancer Mother   . Depression Mother   . Drug abuse Mother   . ADD / ADHD Sister   . Bipolar disorder Sister   . Drug abuse Sister   .  Suicidality Cousin     Social History Social History   Tobacco Use  . Smoking status: Never Smoker  . Smokeless tobacco: Never Used  Substance Use Topics  . Alcohol use: No  . Drug use: Yes    Types: Marijuana     Allergies   Iodine and Shellfish allergy   Review of Systems Review of Systems  Constitutional: Negative for chills.  Skin: Positive for color change and wound.  Neurological: Negative for weakness and numbness.  All other systems reviewed and are negative.    Physical Exam Updated Vital Signs BP (!) 158/89 (BP Location: Right Arm)   Pulse 77   Temp 99.9 F (37.7 C) (Oral)   Resp 18   SpO2 99%   Physical Exam  Constitutional: He  appears well-developed and well-nourished.  HENT:  Head: Normocephalic and atraumatic.  Eyes: Conjunctivae and EOM are normal. Right eye exhibits no discharge. Left eye exhibits no discharge. No scleral icterus.  Pulmonary/Chest: Effort normal.  Neurological: He is alert.  Follows commands, Moves all extremities  5/5 strength to BUE and BLE  Sensation intact throughout all major nerve distributions Normal gait  Skin: Skin is warm and dry.     4 x 5 cm area of warmth, erythema and edema with central raised area that is draining purulent drainage.  It is very indurated with some minimal central fluctuance.  Does not extend over to the midline T-spine.  Psychiatric: He has a normal mood and affect. His speech is normal and behavior is normal.  Nursing note and vitals reviewed.    ED Treatments / Results  Labs (all labs ordered are listed, but only abnormal results are displayed) Labs Reviewed  AEROBIC CULTURE (SUPERFICIAL SPECIMEN)    EKG None  Radiology No results found.  Procedures .Marland Kitchen.Incision and Drainage Date/Time: 03/14/2018 5:14 PM Performed by: Maxwell CaulLayden, Jayvon Mounger A, PA-C Authorized by: Maxwell CaulLayden, Maleko Greulich A, PA-C   Consent:    Consent obtained:  Verbal   Consent given by:  Patient   Risks discussed:  Bleeding, incomplete drainage, pain and damage to other organs   Alternatives discussed:  No treatment Universal protocol:    Procedure explained and questions answered to patient or proxy's satisfaction: yes     Relevant documents present and verified: yes     Test results available and properly labeled: yes     Imaging studies available: yes     Required blood products, implants, devices, and special equipment available: yes     Site/side marked: yes     Immediately prior to procedure a time out was called: yes     Patient identity confirmed:  Verbally with patient Location:    Type:  Abscess   Location:  Trunk   Trunk location:  Back Pre-procedure details:    Skin  preparation:  Betadine Anesthesia (see MAR for exact dosages):    Anesthesia method:  Local infiltration   Local anesthetic:  Lidocaine 2% w/o epi Procedure type:    Complexity:  Complex Procedure details:    Incision types:  Single straight   Incision depth:  Subcutaneous   Scalpel blade:  11   Wound management:  Probed and deloculated and irrigated with saline   Drainage:  Purulent   Drainage amount:  Moderate   Wound treatment:  Wound left open Post-procedure details:    Patient tolerance of procedure:  Procedure terminated at patient's request Comments:     Once the area was anesthetized, was thoroughly and extensively cleaned with  Betadine.  On initial incision, there is thick, purulent drainage noted.  It appeared to be more of an infected epidermal cyst rather than abscess.  The area was deloculated and it was attempted to continue irrigating but patient asked that procedure be terminated secondary to his inability to tolerate.   (including critical care time)  Medications Ordered in ED Medications  lidocaine-EPINEPHrine (XYLOCAINE W/EPI) 2 %-1:100000 (with pres) injection 20 mL (20 mLs Intradermal Not Given 03/14/18 1747)  acetaminophen (TYLENOL) tablet 1,000 mg (1,000 mg Oral Given 03/14/18 1638)  lidocaine (XYLOCAINE) 2 % (with pres) injection 200 mg ( Intradermal Given 03/14/18 1736)     Initial Impression / Assessment and Plan / ED Course  I have reviewed the triage vital signs and the nursing notes.  Pertinent labs & imaging results that were available during my care of the patient were reviewed by me and considered in my medical decision making (see chart for details).     27 year old male who presents for evaluation of redness, pain, swelling noted to back.  Was seen here on 03/11/17 for evaluation of same symptoms.  At the time, no evidence of abscess.  He was discharged home on Keflex which he states he has been taking.  Comes in today for worsening pain and also  notes a raised area that is draining.  No fevers but has had some subjective chills. Patient is afebrile, non-toxic appearing, sitting comfortably on examination table. Vital signs reviewed and stable.  On exam, he has a 4 x 5 cm area of warmth, erythema, induration with central raised area that is draining purulent drainage.  It appears indurated with a very small area of fluctuance.  Area of question is very superficial and does not appear to extend deep.  It does not extend over into the midline T-spine area.  Suspect abscess versus infected sebaceous/epidermal cyst.  History/physical exam is not concerning for epidural abscess.  I discussed treatment options with patient.  He wants to proceed with I&D.  I&D as documented above.  Once I&D procedure was performed, it appeared that this was more of a sebaceous/epidermal cyst that appeared to be infected rather than abscess.  There was some thick, cottage cheeselike purulent drainage noted.  I was unable to complete full explanation of wound and irrigation secondary to patient's request that we terminate procedure.  Will give outpatient surgery referral for further evaluation.  Wound culture sent. Will add Bactrim to his antibiotics to cover both staph and strep.  Instructed patient to return to emergency department in 2 days for wound recheck.  Patient had ample opportunity for questions and discussion. All patient's questions were answered with full understanding. Strict return precautions discussed. Patient expresses understanding and agreement to plan.   Final Clinical Impressions(s) / ED Diagnoses   Final diagnoses:  Infected sebaceous cyst    ED Discharge Orders         Ordered    sulfamethoxazole-trimethoprim (BACTRIM DS,SEPTRA DS) 800-160 MG tablet  2 times daily     03/14/18 1705           Maxwell Caul, PA-C 03/14/18 1809    Pricilla Loveless, MD 03/14/18 2348

## 2018-03-16 LAB — AEROBIC CULTURE  (SUPERFICIAL SPECIMEN)

## 2018-03-16 LAB — AEROBIC CULTURE W GRAM STAIN (SUPERFICIAL SPECIMEN)

## 2018-03-17 ENCOUNTER — Encounter (HOSPITAL_COMMUNITY): Payer: Self-pay | Admitting: *Deleted

## 2018-03-17 ENCOUNTER — Telehealth: Payer: Self-pay

## 2018-03-17 ENCOUNTER — Other Ambulatory Visit: Payer: Self-pay

## 2018-03-17 ENCOUNTER — Emergency Department (HOSPITAL_COMMUNITY)
Admission: EM | Admit: 2018-03-17 | Discharge: 2018-03-18 | Disposition: A | Discharge status: Home / Self Care | Payer: Self-pay | Attending: Emergency Medicine | Admitting: Emergency Medicine

## 2018-03-17 DIAGNOSIS — Z8614 Personal history of Methicillin resistant Staphylococcus aureus infection: Secondary | ICD-10-CM | POA: Insufficient documentation

## 2018-03-17 DIAGNOSIS — J45909 Unspecified asthma, uncomplicated: Secondary | ICD-10-CM | POA: Insufficient documentation

## 2018-03-17 DIAGNOSIS — F319 Bipolar disorder, unspecified: Secondary | ICD-10-CM | POA: Insufficient documentation

## 2018-03-17 DIAGNOSIS — Z79899 Other long term (current) drug therapy: Secondary | ICD-10-CM | POA: Insufficient documentation

## 2018-03-17 DIAGNOSIS — Z590 Homelessness: Secondary | ICD-10-CM | POA: Insufficient documentation

## 2018-03-17 DIAGNOSIS — Z4801 Encounter for change or removal of surgical wound dressing: Secondary | ICD-10-CM | POA: Insufficient documentation

## 2018-03-17 DIAGNOSIS — Z5189 Encounter for other specified aftercare: Secondary | ICD-10-CM

## 2018-03-17 LAB — CBG MONITORING, ED: GLUCOSE-CAPILLARY: 131 mg/dL — AB (ref 70–99)

## 2018-03-17 MED ORDER — SULFAMETHOXAZOLE-TRIMETHOPRIM 800-160 MG PO TABS
1.0000 | ORAL_TABLET | Freq: Once | ORAL | Status: AC
  Administered 2018-03-18: 1 via ORAL
  Filled 2018-03-17: qty 1

## 2018-03-17 MED ORDER — ACETAMINOPHEN 500 MG PO TABS
1000.0000 mg | ORAL_TABLET | Freq: Once | ORAL | Status: AC
  Administered 2018-03-18: 1000 mg via ORAL
  Filled 2018-03-17: qty 2

## 2018-03-17 MED ORDER — DEXTROSE 5 % IV SOLN
400.0000 mg | Freq: Once | INTRAVENOUS | Status: AC
  Administered 2018-03-18: 400 mg via INTRAVENOUS
  Filled 2018-03-17: qty 2.67

## 2018-03-17 NOTE — ED Provider Notes (Signed)
[facility]-EMERGENCY DEPT Provider Note   CSN: [id number] Arrival date & time: 03/17/18  2218     History   Chief Complaint Chief Complaint  Patient presents with  . Wound Check    HPI Daniel [name] is a 27 y.o. male with a history of ADHD, alcohol consumption binge drinking, asthma, bipolar disorder who presents to the emergency department with a chief complaint of wound recheck.  The patient was seen in the ED on 03/11/2018 with an area of redness on the middle of his upper back.  He was discharged with Keflex, which she states he did not take.  Bedside ultrasound during that visit was performed which did not show a fluid collection.  Return to the ED on 03/14/2018 with worsening redness as well as pain and swelling to the middle of his upper back and he was having drainage at that time as well as subjective chills.  An incision and drainage of the area was performed he was diagnosed with an infected sebaceous/epidermoid cyst; however, the procedure was terminated at the patient's request because he was unable to tolerate the pain.  Wound culture was sent, which grew MRSA and he was discharged with Bactrim.  The patient returns today for a wound recheck.  He states that he suffers from homelessness and has not been able to afford to the prescription of antibiotics.  He reports his sister has been changing the bandage over the wound and reports it has been draining a moderate amount of purulent discharge.  He states that the intensity of the pain has improved, but the area of pain around the wound seems to be gradually enlarging.  He reports his intermittent chills have become more constant along with subjective fevers.  He also notes there is a new rash on his right lower back that is pruritic.  He denies drainage from the area or streaking.   He also denies chest pain, dyspnea, nausea, vomiting, diarrhea.  He denies red streaking  The history is provided by the  patient. No language interpreter was used.    Past Medical History:  Diagnosis Date  . ADHD (attention deficit hyperactivity disorder)   . Alcohol consumption binge drinking   . Asthma   . Bipolar disorder [facility])     Patient Active Problem List   Diagnosis Date Noted  . 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 Substance use disorder 08/01/2016    Past Surgical History:  Procedure Laterality Date  . 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 Authorizing Provider  acetaminophen (TYLENOL) 325 MG tablet Take 2 tablets (650 mg total) by mouth every 6 (six) hours as needed. Do not take more than 4000mg  of tylenol per day 07/02/17   Couture, Cortni S, PA-C  albuterol (PROVENTIL HFA;VENTOLIN HFA) 108 (90 Base) MCG/ACT inhaler Inhale 1-2 puffs into the lungs every 6 (six) hours as needed for wheezing or shortness of breath. 08/04/16   Armandina Stammer I, NP  cephALEXin (KEFLEX) 500 MG capsule Take 1 capsule (500 mg total) by mouth 4 (four) times daily for 7 days. 03/11/18 03/18/18  Tegeler, Canary Brim, MD  hydrOXYzine (ATARAX/VISTARIL) 25 MG tablet Take 1 tablet (25 mg total) by mouth 3 (three) times daily as needed for anxiety. 08/04/16   Armandina Stammer I, NP  ibuprofen (ADVIL,MOTRIN) 400 MG  tablet Take 1 tablet (400 mg total) by mouth every 6 (six) hours as needed. 07/02/17   Couture, Cortni S, PA-C  sertraline (ZOLOFT) 50 MG tablet Take 1 tablet (50 mg total) by mouth daily. For depression 08/05/16   Armandina StammerNwoko, Agnes I, NP  sulfamethoxazole-trimethoprim (BACTRIM DS,SEPTRA DS) 800-160 MG tablet Take 1 tablet by mouth 2 (two) times daily for 7 days. 03/14/18 03/21/18  Maxwell CaulLayden, Lindsey A, PA-C  traZODone (DESYREL) 50 MG tablet Take 1 tablet (50 mg total) by mouth at bedtime as needed for sleep. 08/04/16   Sanjuana KavaNwoko, Agnes I, NP    Family History Family History    Problem Relation Age of Onset  . Breast cancer Mother   . Depression Mother   . Drug abuse Mother   . ADD / ADHD Sister   . Bipolar disorder Sister   . Drug abuse Sister   . Suicidality Cousin     Social History Social History   Tobacco Use  . Smoking status: Never Smoker  . Smokeless tobacco: Never Used  Substance Use Topics  . Alcohol use: No  . Drug use: Yes    Types: Marijuana     Allergies   Iodine and Shellfish allergy   Review of Systems Review of Systems  Constitutional: Positive for chills and fever. Negative for appetite change.  Respiratory: Negative for shortness of breath.   Cardiovascular: Negative for chest pain.  Gastrointestinal: Negative for abdominal pain, diarrhea, nausea and vomiting.  Genitourinary: Negative for dysuria.  Musculoskeletal: Positive for back pain and myalgias. Negative for arthralgias, gait problem, neck pain and neck stiffness.  Skin: Positive for wound. Negative for rash.  Allergic/Immunologic: Negative for immunocompromised state.  Neurological: Negative for seizures, weakness, numbness and headaches.  Psychiatric/Behavioral: Negative for confusion.   Physical Exam Updated Vital Signs BP 111/72   Pulse 88   Temp 98.5 F (36.9 C) (Oral)   Resp 16   Ht 5\' 8"  (1.727 m)   Wt 68 kg   SpO2 99%   BMI 22.81 kg/m   Physical Exam  Constitutional: He appears well-developed.  HENT:  Head: Normocephalic.  Eyes: Conjunctivae are normal.  Neck: Neck supple.  Cardiovascular: Normal rate, regular rhythm, normal heart sounds and intact distal pulses. Exam reveals no gallop and no friction rub.  No murmur heard. Pulmonary/Chest: Effort normal and breath sounds normal. No stridor. No respiratory distress. He has no wheezes. He has no rales. He exhibits no tenderness.  Abdominal: Soft. He exhibits no distension.  Musculoskeletal: He exhibits tenderness. He exhibits no edema or deformity.  There is a 5 cm diameter area of erythema  and mild warmth with a center area of purulent drainage.  Tender to palpation in this vicinity.  No red streaking patient is nontender to palpation outside of the erythematous area.  Pain is not out of proportion to exam.  Neurological: He is alert.  Skin: Skin is warm and dry.  Psychiatric: His behavior is normal.  Nursing note and vitals reviewed.  Mid-upper back      ED Treatments / Results  Labs (all labs ordered are listed, but only abnormal results are displayed) Labs Reviewed  CBG MONITORING, ED - Abnormal; Notable for the following components:      Result Value   Glucose-Capillary 131 (*)    All other components within normal limits  CBC WITH DIFFERENTIAL/PLATELET  BASIC METABOLIC PANEL    EKG None  Radiology No results found.  Procedures Procedures (including critical care time)  Medications  Ordered in ED Medications  acetaminophen (TYLENOL) tablet 1,000 mg (has no administration in time range)  clindamycin (CLEOCIN) 400 mg in dextrose 5 % 50 mL IVPB (has no administration in time range)  sulfamethoxazole-trimethoprim (BACTRIM DS,SEPTRA DS) 800-160 MG per tablet 1 tablet (has no administration in time range)     Initial Impression / Assessment and Plan / ED Course  I have reviewed the triage vital signs and the nursing notes.  Pertinent labs & imaging results that were available during my care of the patient were reviewed by me and considered in my medical decision making (see chart for details).     27 year old male with a history of ADHD, alcohol consumption binge drinking, asthma, bipolar disorder who presents to the emergency department with a chief complaint of wound check.  The patient suffers from homelessness.  He underwent I&D earlier this week and was discharged from Bactrim, but never filled the prescription.  He reports worsening fever and chills.  Afebrile in the ED and without tachycardia.  He has no history of immunocompromising pathology,  including HIV and diabetes mellitus.  CBG in the ED is 131.  On exam, there is an approximately 5 cm diameter of erythema and mild warmth noted to the upper back with a central area of purulence.  He has been tender to palpation over the erythematous area, but there is no red streaking and pain is not out of proportion to exam.  Discussed the patient with Dr. Judd Lien, attending physician.  Culture from previous ED visit positive for MRSA.  Will order 1 dose of IV clindamycin and first dose of Bactrim in the ED.  Will order basic labs since this is the patient's third visit for this wound.  He does not warrant admission at this time as he has not failed outpatient treatment since treatment was not initiated.  Tylenol given for pain control.   Patient care transferred to PA Upstill at the end of my shift as the patient is pending IV antibiotics completion, lab results, and follow-up to ensure the patient can afford the prescription of antibiotics.  If not able, the patient may require a social work consult.  Patient presentation, ED course, and plan of care discussed with review of all pertinent labs and imaging. Please see his/her note for further details regarding further ED course and disposition.   Final Clinical Impressions(s) / ED Diagnoses   Final diagnoses:  None    ED Discharge Orders    None       Barkley Boards, PA-C 03/18/18 0038    Geoffery Lyons, MD 03/18/18 1556

## 2018-03-17 NOTE — Telephone Encounter (Signed)
Post ED Visit - Positive Culture Follow-up  Culture report reviewed by antimicrobial stewardship pharmacist:  []  Daniel [name], Pharm.D. []  Daniel [name], Pharm.D., BCPS AQ-ID []  Daniel [name], Pharm.D., BCPS []  Daniel [name], [address] BCPS []  Daniel [name], [address] BCPS, AAHIVP []  Daniel [name], Pharm.D., BCPS, AAHIVP [x]  Daniel [name], PharmD, BCPS []  Daniel [name], PharmD, BCPS []  Daniel [name], PharmD, BCPS []  Daniel [name], PharmD  Positive aerobic culture Treated with BactrimDS, organism sensitive to the same and no further patient follow-up is required at this time.  Daniel [name], Daniel [name] 03/17/2018, 9:45 AM

## 2018-03-17 NOTE — ED Provider Notes (Signed)
See 6 days ago with rash that developed abscess that was I&D'd on subsequent visit.  Patient is homeless, indigent, has not filled abx Rx's Culture from I&D is MRSA positive He is getting IV Clinda and PO Septra DS  Pending labs, IV medication.   Issue will be getting abx Rx. Will try to arrange through pharmacy to have filled.  Discussed with pharmacy who agrees to fill the patient's prescription for the needed antibiotic to treat MRSA positive skin infection. This is received and given to the patient.   Will have the patient return in 2 days for recheck of wound, which has been irrigated in the ED. Labs are stable. VSS. He is appropriate for discharge home.      Elpidio AnisUpstill, Polo Mcmartin, PA-C 03/18/18 [id number]    Geoffery Lyonselo, Douglas, MD 03/18/18 [phone]

## 2018-03-17 NOTE — ED Triage Notes (Signed)
Pt is here for a wound check, possible redressing and packing change,sts was supposed to come in yesterday but didn't have transportation. Pt still has rx for bactrim which he couldn't afford to fill in. There is some old staining noted on the bandage, no redness, no warmth.

## 2018-03-18 LAB — CBC WITH DIFFERENTIAL/PLATELET
ABS IMMATURE GRANULOCYTES: 0.03 10*3/uL (ref 0.00–0.07)
BASOS ABS: 0 10*3/uL (ref 0.0–0.1)
Basophils Relative: 0 %
EOS ABS: 0.2 10*3/uL (ref 0.0–0.5)
Eosinophils Relative: 5 %
HCT: 43.9 % (ref 39.0–52.0)
Hemoglobin: 13.7 g/dL (ref 13.0–17.0)
Immature Granulocytes: 1 %
LYMPHS ABS: 1.8 10*3/uL (ref 0.7–4.0)
Lymphocytes Relative: 36 %
MCH: 27.3 pg (ref 26.0–34.0)
MCHC: 31.2 g/dL (ref 30.0–36.0)
MCV: 87.5 fL (ref 80.0–100.0)
MONOS PCT: 13 %
Monocytes Absolute: 0.7 10*3/uL (ref 0.1–1.0)
NEUTROS PCT: 45 %
Neutro Abs: 2.2 10*3/uL (ref 1.7–7.7)
PLATELETS: 239 10*3/uL (ref 150–400)
RBC: 5.02 MIL/uL (ref 4.22–5.81)
RDW: 13 % (ref 11.5–15.5)
WBC: 4.9 10*3/uL (ref 4.0–10.5)
nRBC: 0 % (ref 0.0–0.2)

## 2018-03-18 LAB — BASIC METABOLIC PANEL
ANION GAP: 6 (ref 5–15)
BUN: 19 mg/dL (ref 6–20)
CO2: 27 mmol/L (ref 22–32)
Calcium: 9.1 mg/dL (ref 8.9–10.3)
Chloride: 105 mmol/L (ref 98–111)
Creatinine, Ser: 1.06 mg/dL (ref 0.61–1.24)
Glucose, Bld: 95 mg/dL (ref 70–99)
Potassium: 4.2 mmol/L (ref 3.5–5.1)
SODIUM: 138 mmol/L (ref 135–145)

## 2018-03-18 MED ORDER — SULFAMETHOXAZOLE-TRIMETHOPRIM 800-160 MG PO TABS
1.0000 | ORAL_TABLET | Freq: Two times a day (BID) | ORAL | Status: DC
  Filled 2018-03-18: qty 1
  Filled 2018-03-18: qty 28

## 2018-03-18 NOTE — Discharge Instructions (Signed)
It is important for you to take the antibiotic for the infection you have on your back. This has been provided for you. Return here in 2 days for recheck to insure there is improvement. Return sooner with any high fever, severe pain or new symptom of concern.

## 2018-03-18 NOTE — ED Notes (Signed)
Pt complained of his arm itching. Reddness noted around blood pressure cuff, but not around IV site or anywhere else on arm. Pt stated that this did not itch like previous reactions he has had. Provider notified.

## 2018-03-24 ENCOUNTER — Encounter (HOSPITAL_COMMUNITY): Payer: Self-pay | Admitting: Emergency Medicine

## 2018-03-24 ENCOUNTER — Emergency Department (HOSPITAL_COMMUNITY)
Admission: EM | Admit: 2018-03-24 | Discharge: 2018-03-24 | Disposition: A | Discharge status: Home / Self Care | Payer: Self-pay | Attending: Emergency Medicine | Admitting: Emergency Medicine

## 2018-03-24 ENCOUNTER — Other Ambulatory Visit: Payer: Self-pay

## 2018-03-24 DIAGNOSIS — R21 Rash and other nonspecific skin eruption: Secondary | ICD-10-CM | POA: Insufficient documentation

## 2018-03-24 DIAGNOSIS — Z5189 Encounter for other specified aftercare: Secondary | ICD-10-CM

## 2018-03-24 DIAGNOSIS — J45909 Unspecified asthma, uncomplicated: Secondary | ICD-10-CM | POA: Insufficient documentation

## 2018-03-24 DIAGNOSIS — Z89019 Acquired absence of unspecified thumb: Secondary | ICD-10-CM | POA: Insufficient documentation

## 2018-03-24 DIAGNOSIS — Z79899 Other long term (current) drug therapy: Secondary | ICD-10-CM | POA: Insufficient documentation

## 2018-03-24 DIAGNOSIS — L02212 Cutaneous abscess of back [any part, except buttock]: Secondary | ICD-10-CM | POA: Insufficient documentation

## 2018-03-24 MED ORDER — CLOTRIMAZOLE 1 % EX CREA
TOPICAL_CREAM | Freq: Two times a day (BID) | CUTANEOUS | Status: DC
  Administered 2018-03-24: 21:00:00 via TOPICAL
  Filled 2018-03-24: qty 15

## 2018-03-24 NOTE — Discharge Instructions (Signed)
Your abscess appears to be healing appropriately. I would like you to continue to care for it as previously advised. Please finish course of antibiotics.   Your rash on your abdomen/flank appears to be ringworm. Please apply clotrimazole cream to the area every 12 hours for the next 4 weeks.   Please follow up with your primary care provider.   If you develop worsening or new concerning symptoms you can return to the emergency department for re-evaluation.

## 2018-03-24 NOTE — ED Triage Notes (Signed)
Patient comes to get wound checked on his back. Patient has rash on right flank. Patient states he is itching all over.

## 2018-03-24 NOTE — ED Provider Notes (Signed)
[facility]-EMERGENCY DEPT Provider Note   CSN: [id number] Arrival date & time: 03/24/18  1920     History   Chief Complaint Chief Complaint  Patient presents with  . Wound Check  . Rash    HPI Daniel [name] is a 27 y.o. male with a medical history of ADHD, alcohol consumption binge drinking, asthma, bipolar disorder who presents emergency department today for wound recheck and rash.  Patient initially presented to the ED on 11/27 for area of redness.  He returned to the ED on 11/27 and was noted to have a sebaceous cyst versus abscess.  This was drained in the department and a wound culture was sent.  Culture grew out MRSA and he was discharged with Bactrim.  He was not able to afford his prescription so he returned back to the emergency department where a second I&D was performed.  Social work was consulted and the patient was given antibiotics at the time of discharge.  He reports he has been taking this as prescribed.  He notes that the area has been healing appropriately.  There is no longer any redness and the drainage has stopped.  He denies any fevers, chills, nausea, vomiting.  He also reports that he has a pruritic rash on his right lower abdomen/flank.  He notes it is pruritic.  He reports this began prior to onset of antibiotics.  He denies any difficulty swallowing, difficulty breathing, or other areas of rash.  HPI  Past Medical History:  Diagnosis Date  . ADHD (attention deficit hyperactivity disorder)   . Alcohol consumption binge drinking   . Asthma   . Bipolar disorder [facility](HCC)     Patient Active Problem List   Diagnosis Date Noted  . 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 Substance use disorder 08/01/2016    Past Surgical History:  Procedure Laterality Date  . 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 Authorizing Provider  acetaminophen (TYLENOL) 325 MG tablet Take 2 tablets (650 mg total) by mouth every 6 (six) hours as needed. Do not take more than 4000mg  of tylenol per day Patient not taking: Reported on 03/18/2018 07/02/17   Couture, Cortni S, PA-C  albuterol (PROVENTIL HFA;VENTOLIN HFA) 108 (90 Base) MCG/ACT inhaler Inhale 1-2 puffs into the lungs every 6 (six) hours as needed for wheezing or shortness of breath. 08/04/16   Armandina StammerNwoko, Agnes I, NP  hydrOXYzine (ATARAX/VISTARIL) 25 MG tablet Take 1 tablet (25 mg total) by mouth 3 (three) times daily as needed for anxiety. Patient not taking: Reported on 03/18/2018 08/04/16   Armandina StammerNwoko, Agnes I, NP  ibuprofen (ADVIL,MOTRIN) 400 MG tablet Take 1 tablet (400 mg total) by mouth every 6 (six) hours as needed. Patient not taking: Reported on 03/18/2018 07/02/17   Couture, Cortni S, PA-C  sertraline (ZOLOFT) 50 MG tablet Take 1 tablet (50 mg total) by mouth daily. For depression Patient not taking: Reported on 03/18/2018 08/05/16   Armandina StammerNwoko, Agnes I, NP  traZODone (DESYREL) 50 MG tablet Take 1 tablet (50 mg total) by mouth at bedtime as needed for sleep. Patient not taking: Reported on 03/18/2018 08/04/16   Sanjuana KavaNwoko, Agnes I, NP    Family History Family History  Problem Relation Age of Onset  . Breast cancer Mother   . Depression Mother   . Drug abuse  Mother   . ADD / ADHD Sister   . Bipolar disorder Sister   . Drug abuse Sister   . Suicidality Cousin     Social History Social History   Tobacco Use  . Smoking status: Never Smoker  . Smokeless tobacco: Never Used  Substance Use Topics  . Alcohol use: No  . Drug use: Yes    Types: Marijuana     Allergies   Iodine and Shellfish allergy   Review of Systems Review of Systems  All other systems reviewed and are negative.    Physical Exam Updated Vital Signs BP 100/82 (BP Location: Left Arm)   Pulse 84   Temp 97.7 F (36.5 C) (Oral)   Resp 18   Ht 5\' 8"  (1.727 m)   Wt 68 kg    SpO2 100%   BMI 22.79 kg/m   Physical Exam  Constitutional: He appears well-developed and well-nourished.  HENT:  Head: Normocephalic and atraumatic.  Right Ear: External ear normal.  Left Ear: External ear normal.  No angioedema. No oral involvement.   Eyes: Conjunctivae are normal. Right eye exhibits no discharge. Left eye exhibits no discharge. No scleral icterus.  Neck:  No nuchal rigidity or meningismus  Pulmonary/Chest: Effort normal. No respiratory distress.  Neurological: He is alert.  Skin: Skin is warm and dry. No pallor.  Compared to previous pictures patient has a well-healing wound to the right upper scapula as noted in the picture below.  There is no surrounding erythema, induration.  No fluctuance or drainage. Patient also with area that is consistent with ringworm.  There is a scaly, ring appearance rash that is pruritic.  No evidence of superimposed infection.  Nontender to palpation. No blisters, no pustules, no warmth, no draining sinus tracts, no superficial abscesses, no bullous impetigo, no vesicles, no desquamation, no target lesions with dusky purpura or a central bulla. Not tender to touch.  Psychiatric: He has a normal mood and affect.  Nursing note and vitals reviewed.        ED Treatments / Results  Labs (all labs ordered are listed, but only abnormal results are displayed) Labs Reviewed - No data to display  EKG None  Radiology No results found.  Procedures Procedures (including critical care time)  Medications Ordered in ED Medications  clotrimazole (LOTRIMIN) 1 % cream (has no administration in time range)     Initial Impression / Assessment and Plan / ED Course  I have reviewed the triage vital signs and the nursing notes.  Pertinent labs & imaging results that were available during my care of the patient were reviewed by me and considered in my medical decision making (see chart for details).     Pt with abscess seen several days  ago with I&D. Chart reviewed in EPIC. Pt has been doing a very good job of taking care of the site at home and has been taking medications as directed. The site is healthy appearing. No signs of further infection. No evidence of reminant abscess. No surrounding cellulitis and no palpable fluctuance or induration indicating deeper infection or residual abscess. Pt is also without any pain or tenderness in the area.  Rash consistent with Ringworm. Patient denies any difficulty breathing or swallowing.  Pt has a patent airway without stridor and is handling secretions without difficulty; no angioedema. No blisters, no pustules, no warmth, no draining sinus tracts, no superficial abscesses, no bullous impetigo, no vesicles, no desquamation, no target lesions with dusky purpura or a  central bulla. Not tender to touch. No concern for superimposed infection. No concern for SJS, TEN, TSS, tick borne illness, syphilis or other life-threatening condition. Will discharge home with clotrimazole BID for the next 4 weeks. Medication was given in the department, given patient's financial difficulties.   I advised the patient to follow-up with PCP. Specific return precautions discussed. Time was given for all questions to be answered. The patient verbalized understanding and agreement with plan. The patient appears safe for discharge home.  Final Clinical Impressions(s) / ED Diagnoses   Final diagnoses:  Rash  Visit for wound check    ED Discharge Orders    None       Princella Pellegrini 03/24/18 2023    Raeford Razor, MD 03/24/18 2027

## 2018-03-28 ENCOUNTER — Other Ambulatory Visit: Payer: Self-pay

## 2018-03-28 ENCOUNTER — Emergency Department (HOSPITAL_COMMUNITY)
Admission: EM | Admit: 2018-03-28 | Discharge: 2018-03-28 | Disposition: A | Discharge status: Home / Self Care | Payer: Self-pay | Attending: Emergency Medicine | Admitting: Emergency Medicine

## 2018-03-28 ENCOUNTER — Encounter (HOSPITAL_COMMUNITY): Payer: Self-pay | Admitting: Emergency Medicine

## 2018-03-28 DIAGNOSIS — J45909 Unspecified asthma, uncomplicated: Secondary | ICD-10-CM | POA: Insufficient documentation

## 2018-03-28 DIAGNOSIS — R21 Rash and other nonspecific skin eruption: Secondary | ICD-10-CM

## 2018-03-28 DIAGNOSIS — L259 Unspecified contact dermatitis, unspecified cause: Secondary | ICD-10-CM | POA: Insufficient documentation

## 2018-03-28 MED ORDER — HYDROCORTISONE 1 % EX CREA
TOPICAL_CREAM | Freq: Three times a day (TID) | CUTANEOUS | Status: DC
  Administered 2018-03-28: 23:00:00 via TOPICAL
  Filled 2018-03-28: qty 28

## 2018-03-28 NOTE — ED Provider Notes (Signed)
[facility]-EMERGENCY DEPT Provider Note   CSN: [id number] Arrival date & time: 03/28/18  2051     History   Chief Complaint Chief Complaint  Patient presents with  . Rash    HPI Lorrin JacksonJason Downard is a 27 y.o. male.  HPI   He presents for evaluation of recurrent rash on right side, present for a week.  Seen previously on 03/24/2018 and given Lotrimin cream without improvement.  History of same rash in same location previously.  He denies fever, chills, weakness or dizziness.  There are no other known modifying factors.   Past Medical History:  Diagnosis Date  . ADHD (attention deficit hyperactivity disorder)   . Alcohol consumption binge drinking   . Asthma   . Bipolar disorder [facility](HCC)     Patient Active Problem List   Diagnosis Date Noted  . 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 Substance use disorder 08/01/2016    Past Surgical History:  Procedure Laterality Date  . 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 Authorizing Provider  albuterol (PROVENTIL HFA;VENTOLIN HFA) 108 (90 Base) MCG/ACT inhaler Inhale 1-2 puffs into the lungs every 6 (six) hours as needed for wheezing or shortness of breath. 08/04/16  Yes Armandina StammerNwoko, Agnes I, NP  acetaminophen (TYLENOL) 325 MG tablet Take 2 tablets (650 mg total) by mouth every 6 (six) hours as needed. Do not take more than 4000mg  of tylenol per day Patient not taking: Reported on 03/18/2018 07/02/17   Couture, Cortni S, PA-C  hydrOXYzine (ATARAX/VISTARIL) 25 MG tablet Take 1 tablet (25 mg total) by mouth 3 (three) times daily as needed for anxiety. Patient not taking: Reported on 03/18/2018 08/04/16   Armandina StammerNwoko, Agnes I, NP  ibuprofen (ADVIL,MOTRIN) 400 MG tablet Take 1 tablet (400 mg total) by mouth every 6 (six) hours as needed. Patient not taking: Reported on 03/18/2018  07/02/17   Couture, Cortni S, PA-C  sertraline (ZOLOFT) 50 MG tablet Take 1 tablet (50 mg total) by mouth daily. For depression Patient not taking: Reported on 03/18/2018 08/05/16   Armandina StammerNwoko, Agnes I, NP  traZODone (DESYREL) 50 MG tablet Take 1 tablet (50 mg total) by mouth at bedtime as needed for sleep. Patient not taking: Reported on 03/18/2018 08/04/16   Sanjuana KavaNwoko, Agnes I, NP    Family History Family History  Problem Relation Age of Onset  . Breast cancer Mother   . Depression Mother   . Drug abuse Mother   . ADD / ADHD Sister   . Bipolar disorder Sister   . Drug abuse Sister   . Suicidality Cousin     Social History Social History   Tobacco Use  . Smoking status: Never Smoker  . Smokeless tobacco: Never Used  Substance Use Topics  . Alcohol use: No  . Drug use: Yes    Types: Marijuana     Allergies   Iodine and Shellfish allergy   Review of Systems Review of Systems  All other systems reviewed and are negative.    Physical Exam Updated Vital Signs BP 129/80 (BP Location: Left Arm)   Pulse 92   Temp 98 F (36.7 C) (Oral)   Resp 14   SpO2 99%   Physical Exam  Constitutional: He is oriented to person, place, and time. He appears well-developed and well-nourished.  HENT:  Head:  Normocephalic and atraumatic.  Right Ear: External ear normal.  Left Ear: External ear normal.  Eyes: Pupils are equal, round, and reactive to light. Conjunctivae and EOM are normal.  Neck: Normal range of motion and phonation normal. Neck supple.  Cardiovascular: Normal rate.  Pulmonary/Chest: Effort normal. He exhibits no bony tenderness.  Musculoskeletal: Normal range of motion.  Neurological: He is alert and oriented to person, place, and time. No cranial nerve deficit or sensory deficit. He exhibits normal muscle tone. Coordination normal.  Skin: Skin is warm, dry and intact.  Superficial rash about 4 x 6 cm, right lower torso, characterized by continuous superficial inflammatory  changes, without fluctuance, induration or drainage.  This rash appears to be allergic dermatitis.  There are no characteristics of fungal infection.  This rash has a very similar appearance to the photo that was taken when he was seen here on 03/24/2018  Psychiatric: He has a normal mood and affect. His behavior is normal. Judgment and thought content normal.  Nursing note and vitals reviewed.    ED Treatments / Results  Labs (all labs ordered are listed, but only abnormal results are displayed) Labs Reviewed - No data to display  EKG None  Radiology No results found.  Procedures Procedures (including critical care time)  Medications Ordered in ED Medications  hydrocortisone cream 1 % (has no administration in time range)     Initial Impression / Assessment and Plan / ED Course  I have reviewed the triage vital signs and the nursing notes.  Pertinent labs & imaging results that were available during my care of the patient were reviewed by me and considered in my medical decision making (see chart for details).      Patient Vitals for the past 24 hrs:  BP Temp Temp src Pulse Resp SpO2  03/28/18 2105 129/80 98 F (36.7 C) Oral 92 14 99 %    11:07 PM Reevaluation with update and discussion. After initial assessment and treatment, an updated evaluation reveals no change in clinical status, findings discussed with the patient and all questions were answered. Mancel Bale   Medical Decision Making: Rash most consistent with contact dermatitis.  Doubt fungal infection.  No generalized reaction.  CRITICAL CARE-no Performed by: Mancel Bale  Nursing Notes Reviewed/ Care Coordinated Applicable Imaging Reviewed Interpretation of Laboratory Data incorporated into ED treatment  The patient appears reasonably screened and/or stabilized for discharge and I doubt any other medical condition or other [facility] requiring further screening, evaluation, or treatment in the ED at this time  prior to discharge.  Plan: Home Medications-cortisone; Home Treatments-usual bathing; return here if the recommended treatment, does not improve the symptoms; Recommended follow up-PCP, PRN   Final Clinical Impressions(s) / ED Diagnoses   Final diagnoses:  Rash    ED Discharge Orders    None       Mancel Bale, MD 03/30/18 1328

## 2018-03-28 NOTE — ED Triage Notes (Signed)
Patient reports fungal rash to right flank x3 weeks. Seen for same on 12/7. Reports using prescribed cream with no relief. States rash itches.

## 2018-03-28 NOTE — Discharge Instructions (Addendum)
Use the cream on the rash, 2-3 times a day to improve the discomfort.  See your doctor for problems.

## 2018-04-24 ENCOUNTER — Emergency Department (HOSPITAL_COMMUNITY)
Admission: EM | Admit: 2018-04-24 | Discharge: 2018-04-24 | Disposition: A | Discharge status: Home / Self Care | Payer: Self-pay | Attending: Emergency Medicine | Admitting: Emergency Medicine

## 2018-04-24 ENCOUNTER — Encounter (HOSPITAL_COMMUNITY): Payer: Self-pay | Admitting: Emergency Medicine

## 2018-04-24 ENCOUNTER — Other Ambulatory Visit: Payer: Self-pay

## 2018-04-24 DIAGNOSIS — J45909 Unspecified asthma, uncomplicated: Secondary | ICD-10-CM | POA: Insufficient documentation

## 2018-04-24 DIAGNOSIS — L989 Disorder of the skin and subcutaneous tissue, unspecified: Secondary | ICD-10-CM | POA: Insufficient documentation

## 2018-04-24 DIAGNOSIS — L988 Other specified disorders of the skin and subcutaneous tissue: Secondary | ICD-10-CM

## 2018-04-24 MED ORDER — CLOTRIMAZOLE-BETAMETHASONE 1-0.05 % EX CREA: TOPICAL_CREAM | CUTANEOUS | 0 refills | Status: DC

## 2018-04-24 NOTE — ED Triage Notes (Signed)
Pt arriving POV with a rash on his right side. Pt states the rash has been present x2 months. Pt was given prescription which he used to completion. Pt reports the area has gotten smaller but it still itchy.

## 2018-04-24 NOTE — ED Provider Notes (Signed)
[facility]-EMERGENCY DEPT Provider Note: Lowella Dell, MD, FACEP  CSN: [id number] MRN: [id number] ARRIVAL: 04/24/18 at 2149 ROOM: WA11/WA11   CHIEF COMPLAINT  Rash   HISTORY OF PRESENT ILLNESS  04/24/18 11:02 PM Daniel [name] is a 28 y.o. male who has had a hyperpigmented, scaly plaque on his right flank for about 2 months.  He has been seen for this before and treated with both clotrimazole and hydrocortisone.  He is noted some mild improvement but no eradication.  The lesion is itchy.  He is otherwise without acute complaint.   Past Medical History:  Diagnosis Date  . ADHD (attention deficit hyperactivity disorder)   . Alcohol consumption binge drinking   . Asthma   . Bipolar disorder [facility])     Past Surgical History:  Procedure Laterality Date  . 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 Breast cancer Mother   . Depression Mother   . Drug abuse Mother   . ADD / ADHD Sister   . Bipolar disorder Sister   . Drug abuse Sister   . Suicidality Cousin     Social History   Tobacco Use  . Smoking status: Never Smoker  . Smokeless tobacco: Never Used  Substance Use Topics  . Alcohol use: No  . Drug use: Yes    Types: Marijuana    Prior to Admission medications   Medication Sig Start Date End Date Taking? Authorizing Provider  acetaminophen (TYLENOL) 325 MG tablet Take 2 tablets (650 mg total) by mouth every 6 (six) hours as needed. Do not take more than 4000mg  of tylenol per day Patient not taking: Reported on 03/18/2018 07/02/17   Couture, Cortni S, PA-C  albuterol (PROVENTIL HFA;VENTOLIN HFA) 108 (90 Base) MCG/ACT inhaler Inhale 1-2 puffs into the lungs every 6 (six) hours as needed for wheezing or shortness of breath. 08/04/16   Armandina Stammer I, NP  hydrOXYzine (ATARAX/VISTARIL) 25 MG tablet Take 1 tablet (25 mg total) by mouth 3 (three) times daily as needed for anxiety. Patient not taking: Reported on 03/18/2018 08/04/16   Armandina Stammer I, NP  ibuprofen (ADVIL,MOTRIN) 400 MG tablet Take 1 tablet (400 mg total) by mouth every 6 (six) hours as needed. Patient not taking: Reported on 03/18/2018 07/02/17   Couture, Cortni S, PA-C  sertraline (ZOLOFT) 50 MG tablet Take 1 tablet (50 mg total) by mouth daily. For depression Patient not taking: Reported on 03/18/2018 08/05/16   Armandina Stammer I, NP  traZODone (DESYREL) 50 MG tablet Take 1 tablet (50 mg total) by mouth at bedtime as needed for sleep. Patient not taking: Reported on 03/18/2018 08/04/16   Armandina Stammer I, NP    Allergies Iodine and Shellfish allergy   REVIEW OF SYSTEMS  Negative except as noted here or in the History of Present Illness.   PHYSICAL EXAMINATION  Initial Vital Signs Blood pressure 126/77, pulse 87, temperature 98.3 F (36.8 C), temperature source Oral, resp. rate 14, SpO2 100 %.  Examination General: Well-developed, well-nourished male in no acute distress; appearance consistent with age of record HENT: normocephalic; atraumatic Eyes: Normal appearance Neck: supple Heart: regular rate and rhythm Lungs: clear to auscultation bilaterally Abdomen: soft; nondistended Extremities: No deformity; full range of motion Neurologic: Awake, alert and oriented; motor function intact in all extremities and symmetric; no facial droop Skin: Warm and dry; scaly, hyperpigmented plaque of the right flank, several centimeters in diameter:    Psychiatric:  Normal mood and affect   RESULTS  Summary of this visit's results, reviewed by myself:   EKG Interpretation  Date/Time:    Ventricular Rate:    PR Interval:    QRS Duration:   QT Interval:    QTC Calculation:   R Axis:     Text Interpretation:        Laboratory Studies: No results found for this or any previous visit (from the past 24 hour(s)). Imaging Studies: No results found.  ED COURSE and MDM  Nursing notes and initial vitals signs, including pulse oximetry, reviewed.  Vitals:    04/24/18 2223  BP: 126/77  Pulse: 87  Resp: 14  Temp: 98.3 F (36.8 C)  TempSrc: Oral  SpO2: 100%   It is unclear if this represents dermatitis or a fungal infection.  Will treat with Lotrisone and refer to dermatology as this may need biopsy.  PROCEDURES    ED DIAGNOSES     ICD-10-CM   1. Skin plaque L98.8    \   Ensley Blas, MD 04/24/18 2318

## 2018-05-28 DIAGNOSIS — Z5321 Procedure and treatment not carried out due to patient leaving prior to being seen by health care provider: Secondary | ICD-10-CM | POA: Insufficient documentation

## 2018-05-28 DIAGNOSIS — Z202 Contact with and (suspected) exposure to infections with a predominantly sexual mode of transmission: Secondary | ICD-10-CM | POA: Insufficient documentation

## 2018-05-29 ENCOUNTER — Other Ambulatory Visit: Payer: Self-pay

## 2018-05-29 ENCOUNTER — Emergency Department (HOSPITAL_COMMUNITY)
Admission: EM | Admit: 2018-05-29 | Discharge: 2018-05-29 | Disposition: A | Discharge status: Home / Self Care | Payer: Self-pay | Attending: Emergency Medicine | Admitting: Emergency Medicine

## 2018-05-29 ENCOUNTER — Encounter (HOSPITAL_COMMUNITY): Payer: Self-pay | Admitting: Emergency Medicine

## 2018-05-29 DIAGNOSIS — Z202 Contact with and (suspected) exposure to infections with a predominantly sexual mode of transmission: Secondary | ICD-10-CM

## 2018-05-29 LAB — URINALYSIS, ROUTINE W REFLEX MICROSCOPIC
Bilirubin Urine: NEGATIVE
GLUCOSE, UA: NEGATIVE mg/dL
Hgb urine dipstick: NEGATIVE
KETONES UR: NEGATIVE mg/dL
LEUKOCYTES UA: NEGATIVE
NITRITE: NEGATIVE
PROTEIN: NEGATIVE mg/dL
Specific Gravity, Urine: 1.03 (ref 1.005–1.030)
pH: 5 (ref 5.0–8.0)

## 2018-05-29 MED ORDER — AZITHROMYCIN 250 MG PO TABS
1000.0000 mg | ORAL_TABLET | Freq: Once | ORAL | Status: AC
  Administered 2018-05-29: 1000 mg via ORAL
  Filled 2018-05-29: qty 4

## 2018-05-29 MED ORDER — CEFTRIAXONE SODIUM 250 MG IJ SOLR
250.0000 mg | Freq: Once | INTRAMUSCULAR | Status: AC
  Administered 2018-05-29: 250 mg via INTRAMUSCULAR
  Filled 2018-05-29: qty 250

## 2018-05-29 NOTE — ED Notes (Signed)
Called for triage x3, no answer. 

## 2018-05-29 NOTE — Discharge Instructions (Signed)
You have been treated for gonorrhea and chlamydia today.  Please avoid any sexual intercourse for the next week.  I recommend HIV, syphilis testing as an outpatient at the [facility] department.

## 2018-05-29 NOTE — ED Triage Notes (Signed)
Pt states his partner was treated for gonorrhea, wishes to be tested. Denies symptoms.

## 2018-05-29 NOTE — ED Notes (Signed)
No answer for room 1x

## 2018-05-29 NOTE — ED Notes (Signed)
Patient was called by registration x2.  Staff found them for the second time in the vending area with ear buds.  Staff reminded patient that he was lucky that current staff recognized him from earlier or he would be taken out of the system for not answering when called to a room.

## 2018-05-29 NOTE — ED Provider Notes (Addendum)
CHIEF COMPLAINT: STD exposure  HPI: Patient is a 28 year old male with history of asthma, bipolar disorder, ADHD who presents to the emergency department with concerns for an STD exposure.  States his sexual partner recently tested positive for gonorrhea.  He denies that he is having any symptoms.  No dysuria, hematuria, penile discharge, testicular pain or swelling, abdominal pain, vomiting, diarrhea, fever.  Requesting treatment and testing today.  ROS: See HPI Constitutional: no fever  Eyes: no drainage  ENT: no runny nose   Cardiovascular:  no chest pain  Resp: no SOB  GI: no vomiting GU: no dysuria Integumentary: no rash  Allergy: no hives  Musculoskeletal: no leg swelling  Neurological: no slurred speech ROS otherwise negative  PAST MEDICAL HISTORY/PAST SURGICAL HISTORY:  Past Medical History:  Diagnosis Date  . ADHD (attention deficit hyperactivity disorder)   . Alcohol consumption binge drinking   . Asthma   . Bipolar disorder (HCC)     MEDICATIONS:  Prior to Admission medications   Medication Sig Start Date End Date Taking? Authorizing Provider  albuterol (PROVENTIL HFA;VENTOLIN HFA) 108 (90 Base) MCG/ACT inhaler Inhale 1-2 puffs into the lungs every 6 (six) hours as needed for wheezing or shortness of breath. 08/04/16   Armandina Stammer I, NP  clotrimazole-betamethasone (LOTRISONE) cream Apply to affected area 2 times daily prn 04/24/18   Molpus, Jonny Ruiz, MD    ALLERGIES:  Allergies  Allergen Reactions  . Iodine Rash  . Shellfish Allergy Rash    SOCIAL HISTORY:  Social History   Tobacco Use  . Smoking status: Never Smoker  . Smokeless tobacco: Never Used  Substance Use Topics  . Alcohol use: No    FAMILY HISTORY: Family History  Problem Relation Age of Onset  . Breast cancer Mother   . Depression Mother   . Drug abuse Mother   . ADD / ADHD Sister   . Bipolar disorder Sister   . Drug abuse Sister   . Suicidality Cousin     EXAM: BP 106/71 (BP Location:  Right Arm)   Pulse 71   Temp 98 F (36.7 C) (Oral)   Resp 19   SpO2 100%  CONSTITUTIONAL: Alert and oriented and responds appropriately to questions. Well-appearing; well-nourished HEAD: Normocephalic EYES: Conjunctivae clear, pupils appear equal, EOMI ENT: normal nose; moist mucous membranes NECK: Supple, no meningismus, no nuchal rigidity, no LAD  CARD: RRR; S1 and S2 appreciated; no murmurs, no clicks, no rubs, no gallops RESP: Normal chest excursion without splinting or tachypnea; breath sounds clear and equal bilaterally; no wheezes, no rhonchi, no rales, no hypoxia or respiratory distress, speaking full sentences ABD/GI: Normal bowel sounds; non-distended; soft, non-tender, no rebound, no guarding, no peritoneal signs, no hepatosplenomegaly BACK:  The back appears normal and is non-tender to palpation, there is no CVA tenderness EXT: Normal ROM in all joints; non-tender to palpation; no edema; normal capillary refill; no cyanosis, no calf tenderness or swelling    SKIN: Normal color for age and race; warm; no rash NEURO: Moves all extremities equally PSYCH: The patient's mood and manner are appropriate. Grooming and personal hygiene are appropriate.  MEDICAL DECISION MAKING: Patient here requesting testing and treatment for gonorrhea, chlamydia today.  Urine shows no sign of infection.  No trichomonas.  Offered testing versus empiric treatment today given positive exposure.  He opts for empiric treatment.  Given azithromycin and ceftriaxone.  We will have him follow-up with health department for further STD screening as an outpatient.  Discussed return precautions.  At this time, I do not feel there is any life-threatening condition present. I have reviewed and discussed all results (EKG, imaging, lab, urine as appropriate) and exam findings with patient/family. I have reviewed nursing notes and appropriate previous records.  I feel the patient is safe to be discharged home without  further emergent workup and can continue workup as an outpatient as needed. Discussed usual and customary return precautions. Patient/family verbalize understanding and are comfortable with this plan.  Outpatient follow-up has been provided as needed. All questions have been answered.       Ward, Layla Maw, DO 05/29/18 [phone]

## 2019-08-25 ENCOUNTER — Other Ambulatory Visit: Payer: Self-pay

## 2019-08-25 ENCOUNTER — Emergency Department (HOSPITAL_COMMUNITY)
Admission: EM | Admit: 2019-08-25 | Discharge: 2019-08-25 | Disposition: A | Discharge status: Home / Self Care | Payer: Self-pay | Attending: Emergency Medicine | Admitting: Emergency Medicine

## 2019-08-25 ENCOUNTER — Encounter (HOSPITAL_COMMUNITY): Payer: Self-pay

## 2019-08-25 DIAGNOSIS — J45909 Unspecified asthma, uncomplicated: Secondary | ICD-10-CM | POA: Insufficient documentation

## 2019-08-25 DIAGNOSIS — R369 Urethral discharge, unspecified: Secondary | ICD-10-CM

## 2019-08-25 DIAGNOSIS — F909 Attention-deficit hyperactivity disorder, unspecified type: Secondary | ICD-10-CM | POA: Insufficient documentation

## 2019-08-25 DIAGNOSIS — A64 Unspecified sexually transmitted disease: Secondary | ICD-10-CM

## 2019-08-25 DIAGNOSIS — A549 Gonococcal infection, unspecified: Secondary | ICD-10-CM | POA: Insufficient documentation

## 2019-08-25 MED ORDER — STERILE WATER FOR INJECTION IJ SOLN
1.0000 mL | Freq: Once | INTRAMUSCULAR | Status: AC
  Administered 2019-08-25: 1 mL via INTRAMUSCULAR
  Filled 2019-08-25: qty 10

## 2019-08-25 MED ORDER — CEFTRIAXONE SODIUM 500 MG IJ SOLR
500.0000 mg | Freq: Once | INTRAMUSCULAR | Status: AC
  Administered 2019-08-25: 21:00:00 500 mg via INTRAMUSCULAR
  Filled 2019-08-25: qty 500

## 2019-08-25 MED ORDER — DOXYCYCLINE HYCLATE 100 MG PO CAPS: 100.0000 mg | ORAL_CAPSULE | Freq: Two times a day (BID) | ORAL | 0 refills | Status: AC

## 2019-08-25 NOTE — ED Triage Notes (Signed)
Pt arrives POV for eval of rash to L testicle and penile discharge x 3 days. Reports partner w/ same sx

## 2019-08-25 NOTE — Discharge Instructions (Signed)
You were evaluated in the Emergency Department and after careful evaluation, we did not find any emergent condition requiring admission or further testing in the hospital.  Your symptoms seem to be due to a sexually transmitted disease, we suspect gonorrhea.  You may also have some inflammation or infection of the skin of the scrotum.  We will send your urine sample to the lab for testing.  Please take the doxycycline antibiotic as directed for the next 2 weeks.  Please return to the Emergency Department if you experience any worsening of your condition.  We encourage you to follow up with a primary care provider.  Thank you for allowing [place] to be a part of your care.

## 2019-08-25 NOTE — ED Provider Notes (Signed)
[facility]-EMERGENCY DEPT [facility] Emergency Department Provider Note MRN:  [id number]  Arrival date & time: 08/25/19     Chief Complaint   SEXUALLY TRANSMITTED DISEASE   History of Present Illness   Daniel [name] is a 29 y.o. year-old male with a history of bipolar disorder presenting to the ED with chief complaint of sexually transmitted disease.  Patient endorsing 3 days of penile discharge.  Today also noticed some redness to the skin of his scrotum on the left.  Denies testicular pain or tenderness.  Denies fever.  His sexual partner has similar symptoms.  He thinks he has an STD.  Denies abdominal pain.  Review of Systems  A problem-focused ROS was performed. Positive for penile discharge.  Patient denies fever, denies abdominal pain. Patient's Health History    Past Medical History:  Diagnosis Date  . ADHD (attention deficit hyperactivity disorder)   . Alcohol consumption binge drinking   . Asthma   . Bipolar disorder [facility])     Past Surgical History:  Procedure Laterality Date  . 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 Breast cancer Mother   . Depression Mother   . Drug abuse Mother   . ADD / ADHD Sister   . Bipolar disorder Sister   . Drug abuse Sister   . Suicidality Cousin     Social History   Socioeconomic History  . Marital status: Single    Spouse name: Not on file  . Number of children: Not on file  . Years of education: Not on file  . Highest education level: Not on file  Occupational History  . Not on file  Tobacco Use  . Smoking status: Never Smoker  . Smokeless tobacco: Never Used  Substance and Sexual Activity  . Alcohol use: No  . Drug use: Yes    Types: Marijuana  . Sexual activity: Yes  Other Topics Concern  . Not on file  Social History Narrative  . Not on file   Social Determinants of Health   Financial Resource Strain:   . Difficulty of Paying Living Expenses:   Food Insecurity:    . Worried About Programme researcher, broadcasting/film/video in the Last Year:   . Barista in the Last Year:   Transportation Needs:   . Freight forwarder (Medical):   Marland Kitchen Lack of Transportation (Non-Medical):   Physical Activity:   . Days of Exercise per Week:   . Minutes of Exercise per Session:   Stress:   . Feeling of Stress :   Social Connections:   . Frequency of Communication with Friends and Family:   . Frequency of Social Gatherings with Friends and Family:   . Attends Religious Services:   . Active Member of Clubs or Organizations:   . Attends Banker Meetings:   Marland Kitchen Marital Status:   Intimate Partner Violence:   . Fear of Current or Ex-Partner:   . Emotionally Abused:   Marland Kitchen Physically Abused:   . Sexually Abused:      Physical Exam   Vitals:   08/25/19 2037  BP: 123/74  Pulse: 79  Resp: 18  Temp: 98.1 F (36.7 C)  SpO2: 99%    CONSTITUTIONAL: Well-appearing, NAD NEURO:  Alert and oriented x 3, no focal deficits EYES:  eyes equal and reactive ENT/NECK:  no LAD, no JVD CARDIO: Regular rate, well-perfused, normal S1 and S2 PULM:  CTAB  no wheezing or rhonchi GI/GU:  normal bowel sounds, non-distended, non-tender, copious penile discharge, no lymphadenopathy, mild erythema to the left scrotum with no testicular pain or swelling MSK/SPINE:  No gross deformities, no edema SKIN:  no rash, atraumatic PSYCH:  Appropriate speech and behavior  *Additional and/or pertinent findings included in MDM below  Diagnostic and Interventional Summary    EKG Interpretation  Date/Time:    Ventricular Rate:    PR Interval:    QRS Duration:   QT Interval:    QTC Calculation:   R Axis:     Text Interpretation:        Labs Reviewed  GC/CHLAMYDIA PROBE AMP ([place]) NOT AT [facility]    No orders to display    Medications  cefTRIAXone (ROCEPHIN) injection 500 mg (500 mg Intramuscular Given 08/25/19 2038)  sterile water (preservative free) injection 1 mL (1 mL  Intramuscular Given 08/25/19 2040)     Procedures  /  Critical Care Procedures  ED Course and Medical Decision Making  I have reviewed the triage vital signs, the nursing notes, and pertinent available records from the EMR.  Listed above are laboratory and imaging tests that I personally ordered, reviewed, and interpreted and then considered in my medical decision making (see below for details).      Copious discharge, suspect gonorrhea, no signs of systemic illness.  Unclear if scrotal skin is mildly irritated over there is also a bacterial cellulitis.  Will provide IM ceftriaxone and prescription for 2 weeks of doxycycline, which should cover both cellulitis and STD.    Barth Kirks. Sedonia Small, [place] [email or web address]  Final Clinical Impressions(s) / ED Diagnoses     ICD-10-CM   1. Penile discharge  R36.9   2. STD (male)  A64     ED Discharge Orders         Ordered    doxycycline (VIBRAMYCIN) 100 MG capsule  2 times daily     08/25/19 2032           Discharge Instructions Discussed with and Provided to Patient:     Discharge Instructions     You were evaluated in the Emergency Department and after careful evaluation, we did not find any emergent condition requiring admission or further testing in the hospital.  Your symptoms seem to be due to a sexually transmitted disease, we suspect gonorrhea.  You may also have some inflammation or infection of the skin of the scrotum.  We will send your urine sample to the lab for testing.  Please take the doxycycline antibiotic as directed for the next 2 weeks.  Please return to the Emergency Department if you experience any worsening of your condition.  We encourage you to follow up with a primary care provider.  Thank you for allowing [place] to be a part of your care.       Maudie Flakes, MD 08/25/19 2042

## 2019-08-26 LAB — GC/CHLAMYDIA PROBE AMP (~~LOC~~) NOT AT ARMC
Chlamydia: NEGATIVE
Comment: NEGATIVE
Comment: NORMAL
Neisseria Gonorrhea: POSITIVE — AB

## 2019-10-07 ENCOUNTER — Ambulatory Visit (HOSPITAL_COMMUNITY)
Admission: RE | Admit: 2019-10-07 | Discharge: 2019-10-07 | Disposition: A | Discharge status: Home / Self Care | Payer: Self-pay | Attending: Psychiatry | Admitting: Psychiatry

## 2019-10-07 ENCOUNTER — Ambulatory Visit (HOSPITAL_COMMUNITY)
Admission: AD | Admit: 2019-10-07 | Discharge: 2019-10-07 | Disposition: A | Discharge status: Home / Self Care | Payer: Self-pay | Attending: Psychiatry | Admitting: Psychiatry

## 2019-10-07 DIAGNOSIS — F1510 Other stimulant abuse, uncomplicated: Secondary | ICD-10-CM | POA: Insufficient documentation

## 2019-10-07 NOTE — BH Assessment (Addendum)
Assessment Note  Daniel [name] is an 29 y.o. male. Pt presents to [facility] as a walk in accompanied by his girlfriend for homelessness and methamphetamine use. Pt states he used meth the night before, states he has used daily last few months, denies any use of alcohol and other substances. Pt currently states he and his girlfriend are currently homeless but he is currently employed. Pt denies current SI, HI, AVH and self harm. Pt admits to one previous SI attempt in 2016 due to overdose. Pt also states previous inpatient history at [facility] in [facility]. Pt currently has no previous providers and not taking any medications. Pt states he does have history of ADHD but when he was much younger. Pt currently denies any symptoms of depression but states he has not slept in several days and has not ate for several days due to drug use daily. Pt denies any past trauma/abuse or any family mental health/substance use. Pt states he did have a panic attack yesterday and has history of them. Pt states he wants detox services and wants to stop abusing meth. Pt denies any violence/access to weapons at this time. Pt can contract for safety at this time.  During assessment pt presented with logical an coherent speech but was clearly withdrawing from a substance (meth). Pt was oriented x3, dressed casual. Pt did not present to be responding to internal/external stimuli or delusional content.  Diagnosis: Stimulant Use Disorder   Past Medical History:  Past Medical History:  Diagnosis Date  . ADHD (attention deficit hyperactivity disorder)   . Alcohol consumption binge drinking   . Asthma   . Bipolar disorder [facility])     Past Surgical History:  Procedure Laterality Date  . 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 Breast cancer Mother   . Depression Mother   . Drug abuse Mother   . ADD / ADHD Sister   . Bipolar disorder Sister   . Drug abuse Sister   .  Suicidality Cousin     Social History:  reports that he has never smoked. He has never used smokeless tobacco. He reports current drug use. Drug: Marijuana. He reports that he does not drink alcohol.  Additional Social History:  Alcohol / Drug Use Pain Medications: see MAR Prescriptions: see MAR Over the Counter: see MAR History of alcohol / drug use?: Yes Substance #1 Name of Substance 1: meth  CIWA:   COWS:    Allergies:  Allergies  Allergen Reactions  . Iodine Rash  . Shellfish Allergy Rash    Home Medications: (Not in a hospital admission)   OB/GYN Status:  No LMP for male patient.  General Assessment Data Location of Assessment: [facility] Assessment Services TTS Assessment: In system Is this a Tele or Face-to-Face Assessment?: Face-to-Face Is this an Initial Assessment or a Re-assessment for this encounter?: Initial Assessment Patient Accompanied by:: N/A Language Other than English: No Living Arrangements: Homeless/Shelter What gender do you identify as?: Male Date Telepsych consult ordered in [facility]: 10/07/19 Time Telepsych consult ordered in [facility]: 1630 Marital status: Long term relationship Pregnancy Status: No Living Arrangements: Other (Comment) Can pt return to current living arrangement?: Yes Admission Status: Voluntary Is patient capable of signing voluntary admission?: Yes Referral Source: Self/Family/Friend     Crisis Care Plan Living Arrangements: Other (Comment) Legal Guardian: Other: (self) Name of Psychiatrist: none Name of Therapist: none  Education Status Is patient  currently in school?: No Is the patient employed, unemployed or receiving disability?: Employed  Risk to self with the past 6 months Suicidal Ideation: No Has patient been a risk to self within the past 6 months prior to admission? : No Suicidal Intent: No Has patient had any suicidal intent within the past 6 months prior to admission? : No Is patient at risk for suicide?:  No Suicidal Plan?: No Has patient had any suicidal plan within the past 6 months prior to admission? : No Access to Means: No What has been your use of drugs/alcohol within the last 12 months?: meth Previous Attempts/Gestures: Yes How many times?: 1 Other Self Harm Risks: 1 past SI attempt Triggers for Past Attempts: Unknown Intentional Self Injurious Behavior: None Family Suicide History: No Recent stressful life event(s): Other (Comment) Persecutory voices/beliefs?: No Depression: No Depression Symptoms:  (pt denies) Substance abuse history and/or treatment for substance abuse?: Yes Suicide prevention information given to non-admitted patients: Not applicable  Risk to Others within the past 6 months Homicidal Ideation: No Does patient have any lifetime risk of violence toward others beyond the six months prior to admission? : No Thoughts of Harm to Others: No Current Homicidal Intent: No Current Homicidal Plan: No Access to Homicidal Means: No Identified Victim: none History of harm to others?: No Assessment of Violence: None Noted Violent Behavior Description: none Does patient have access to weapons?: No Criminal Charges Pending?: No Does patient have a court date: No Is patient on probation?: No  Psychosis Hallucinations: None noted Delusions: None noted  Mental Status Report Appearance/Hygiene: Unremarkable Eye Contact: Fair Motor Activity: Freedom of movement Speech: Logical/coherent Level of Consciousness: Quiet/awake Mood: Euthymic Affect: Appropriate to circumstance Anxiety Level: None Thought Processes: Coherent Judgement: Impaired Orientation: Person, Place, Situation Obsessive Compulsive Thoughts/Behaviors: None  Cognitive Functioning Concentration: Fair Memory: Recent Intact Is patient IDD: No Insight: Fair Impulse Control: Poor Appetite: Poor Have you had any weight changes? : No Change Sleep: Decreased Total Hours of Sleep:   (unknown) Vegetative Symptoms: None  ADLScreening [facility] Assessment Services) Patient's cognitive ability adequate to safely complete daily activities?: Yes Patient able to express need for assistance with ADLs?: Yes Independently performs ADLs?: Yes (appropriate for developmental age)  Prior Inpatient Therapy Prior Inpatient Therapy: Yes Prior Therapy Dates: 2016 Prior Therapy Facilty/Provider(s): [facility] Reason for Treatment: unknown  Prior Outpatient Therapy Prior Outpatient Therapy: No Does patient have an ACCT team?: No Does patient have Intensive In-House Services?  : No Does patient have [facility] services? : No Does patient have P4CC services?: No  ADL Screening (condition at time of admission) Patient's cognitive ability adequate to safely complete daily activities?: Yes Patient able to express need for assistance with ADLs?: Yes Independently performs ADLs?: Yes (appropriate for developmental age)             Disposition: Lindon Romp, FNP recommends pt is psych cleared. TTS provided pt with intensive outpatient resources for substance abuse treatment. Disposition Initial Assessment Completed for this Encounter: Yes Disposition of Patient: Discharge  On Site Evaluation by:  Antony Contras, Latanya Presser Reviewed with Physician:  Lindon Romp, FNP  Gloriajean Dell Staphany Ditton 10/07/2019 9:05 PM

## 2019-10-07 NOTE — H&P (Signed)
Behavioral Health Medical Screening Exam  Daniel [name] is an 29 y.o. male.Patient examined vis telepsych  for MSE completion. Patient is requesting substance abuse resources for methamphetamine use. Denies suicidal thoughts, homicidal thoughts, and AVH.    Psychiatric Specialty Exam: Physical Exam  Constitutional: He is oriented to person, place, and time. He is cooperative.  Non-toxic appearance. He does not appear ill. No distress.  Respiratory: Effort normal.  Neurological: He is alert and oriented to person, place, and time.  Skin: He is not diaphoretic.  Psychiatric: His behavior is normal. His mood appears anxious. Thought content is not paranoid and not delusional. He does not exhibit a depressed mood. He expresses no homicidal and no suicidal ideation.   Review of Systems  Constitutional: Negative for activity change, appetite change, chills, diaphoresis, fatigue, fever and unexpected weight change.  Respiratory: Negative for cough and shortness of breath.   Cardiovascular: Negative for chest pain and palpitations.  Gastrointestinal: Negative for diarrhea, nausea and vomiting.  Neurological: Negative for dizziness.  Psychiatric/Behavioral: Negative for dysphoric mood, hallucinations and suicidal ideas. The patient is nervous/anxious.   All other systems reviewed and are negative.  There were no vitals taken for this visit.There is no height or weight on file to calculate BMI. General Appearance: Casual Eye Contact:  Good Speech:  Clear and Coherent and Normal Rate Volume:  Normal Mood:  Anxious Affect:  Congruent Thought Process:  Coherent, Goal Directed, Linear and Descriptions of Associations: Intact Orientation:  Full (Time, Place, and Person) Thought Content:  Logical Suicidal Thoughts:  No Homicidal Thoughts:  No Memory:  Immediate;   Good Recent;   Good Judgement:  Fair Insight:  Fair Psychomotor Activity:  Normal Concentration: Concentration: Good and Attention  Span: Good Recall:  Good Fund of Knowledge:Good Language: Good Akathisia:  Negative  AIMS (if indicated):    Assets:  Communication Skills Desire for Improvement Physical Health Sleep:     Musculoskeletal: Strength & Muscle Tone: within normal limits Gait & Station: normal Patient leans: N/A   Recommendations: Based on my evaluation the patient does not appear to have an emergency medical condition.   Disposition: No evidence of imminent risk to self or others at present.   Patient does not meet criteria for psychiatric inpatient admission. Supportive therapy provided about ongoing stressors. Discussed crisis plan, support from social network, calling 911, coming to the Emergency Department, and calling Suicide Hotline.   Jackelyn Poling, NP 10/07/2019, 10:13 PM

## 2020-08-07 LAB — HIV ANTIBODY (ROUTINE TESTING W REFLEX): HIV Screen 4th Generation wRfx: REACTIVE — AB

## 2020-08-09 ENCOUNTER — Other Ambulatory Visit: Payer: Self-pay

## 2020-08-09 ENCOUNTER — Encounter (HOSPITAL_BASED_OUTPATIENT_CLINIC_OR_DEPARTMENT_OTHER): Payer: Self-pay | Admitting: Urology

## 2020-08-09 ENCOUNTER — Emergency Department (HOSPITAL_BASED_OUTPATIENT_CLINIC_OR_DEPARTMENT_OTHER)
Admission: EM | Admit: 2020-08-09 | Discharge: 2020-08-09 | Disposition: A | Discharge status: Home / Self Care | Payer: Self-pay | Attending: Emergency Medicine | Admitting: Emergency Medicine

## 2020-08-09 DIAGNOSIS — J45909 Unspecified asthma, uncomplicated: Secondary | ICD-10-CM | POA: Insufficient documentation

## 2020-08-09 DIAGNOSIS — Z711 Person with feared health complaint in whom no diagnosis is made: Secondary | ICD-10-CM

## 2020-08-09 DIAGNOSIS — Z202 Contact with and (suspected) exposure to infections with a predominantly sexual mode of transmission: Secondary | ICD-10-CM | POA: Insufficient documentation

## 2020-08-09 NOTE — ED Provider Notes (Signed)
[facility] EMERGENCY DEPARTMENT Provider Note   CSN: [id number] Arrival date & time: 08/09/20  2703     History Chief Complaint  Patient presents with  . Exposure to STD    Daniel [name] is a 30 y.o. male.  Patient had presumptive positive rapid HIV test at [facility] department.  Wanted blood test here as well.  Denies any symptoms.  The history is provided by the patient.  Exposure to STD This is a new problem. The problem has not changed since onset.Pertinent negatives include no chest pain, no abdominal pain and no shortness of breath. Nothing aggravates the symptoms. He has tried nothing for the symptoms. The treatment provided no relief.       Past Medical History:  Diagnosis Date  . ADHD (attention deficit hyperactivity disorder)   . Alcohol consumption binge drinking   . Asthma   . Bipolar disorder [facility])     Patient Active Problem List   Diagnosis Date Noted  . 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 Substance use disorder 08/01/2016    Past Surgical History:  Procedure Laterality Date  . 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 Breast cancer Mother   . Depression Mother   . Drug abuse Mother   . ADD / ADHD Sister   . Bipolar disorder Sister   . Drug abuse Sister   . Suicidality Cousin     Social History   Tobacco Use  . Smoking status: Never Smoker  . Smokeless tobacco: Never Used  Vaping Use  . Vaping Use: Every day  Substance Use Topics  . Alcohol use: Yes    Comment: socially   . Drug use: Yes    Types: Marijuana    Home Medications Prior to Admission medications   Medication Sig Start Date End Date Taking? Authorizing Provider  albuterol (PROVENTIL HFA;VENTOLIN HFA) 108 (90 Base) MCG/ACT inhaler Inhale 1-2 puffs into the lungs every 6 (six) hours as needed for wheezing or shortness of breath. 08/04/16   Armandina Stammer I, NP   clotrimazole-betamethasone (LOTRISONE) cream Apply to affected area 2 times daily prn 04/24/18   Molpus, John, MD    Allergies    Iodine and Shellfish allergy  Review of Systems   Review of Systems  Constitutional: Negative for chills, fatigue and fever.  Respiratory: Negative for shortness of breath.   Cardiovascular: Negative for chest pain.  Gastrointestinal: Negative for abdominal pain.    Physical Exam Updated Vital Signs BP 125/81 (BP Location: Right Arm)   Pulse 75   Temp 98 F (36.7 C) (Oral)   Ht 5\' 8"  (1.727 m)   Wt 76.7 kg   SpO2 99%   BMI 25.70 kg/m   Physical Exam Constitutional:      General: He is not in acute distress.    Appearance: He is not ill-appearing.  HENT:     Mouth/Throat:     Mouth: Mucous membranes are moist.  Cardiovascular:     Pulses: Normal pulses.  Abdominal:     Tenderness: There is no abdominal tenderness.  Neurological:     Mental Status: He is alert.     ED Results / Procedures / Treatments   Labs (all labs ordered are listed, but only abnormal results are displayed) Labs Reviewed  HIV ANTIBODY (ROUTINE TESTING W REFLEX)    EKG None  Radiology No results found.  Procedures Procedures   Medications Ordered in ED Medications - No data to display  ED Course  I have reviewed the triage vital signs and the nursing notes.  Pertinent labs & imaging results that were available during my care of the patient were reviewed by me and considered in my medical decision making (see chart for details).    MDM Rules/Calculators/A&P                          Lorrin Jackson patient here for repeat HIV testing.  Had presumptive positive rapid HIV at health department.  Confirmatory testing has been sent.  We will retest for his wishes.  We will have him follow-up with infectious disease.  Overall he is asymptomatic.  Discharged in good condition.  This chart was dictated using voice recognition software.  Despite best efforts to  proofread,  errors can occur which can change the documentation meaning.   Final Clinical Impression(s) / ED Diagnoses Final diagnoses:  Concern about STD in male without diagnosis    Rx / DC Orders ED Discharge Orders    None       Virgina Norfolk, DO 08/09/20 [phone]

## 2020-08-09 NOTE — ED Triage Notes (Signed)
Pt states received call from Health Dept 1 week ago stating he had been exposed to HIV with past partner. Rapid HIV with health dept resulted positive. Pt wants another test to confirm positive.  Denies any symptoms at this time.

## 2020-08-09 NOTE — ED Notes (Signed)
ED Provider at bedside. 

## 2020-08-10 ENCOUNTER — Telehealth: Payer: Self-pay

## 2020-08-10 NOTE — Telephone Encounter (Signed)
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  Can we see about getting him into clinic for new B20 and rapid start appt this week?  Thanks, Greig Castilla

## 2020-08-11 ENCOUNTER — Other Ambulatory Visit (HOSPITAL_COMMUNITY): Payer: Self-pay

## 2020-08-11 ENCOUNTER — Telehealth: Payer: Self-pay

## 2020-08-11 NOTE — Telephone Encounter (Signed)
[facility] Patient Product/process development scientist completed.    The patient is insured through [facility] and has a $3473.30 copay.  Dovato, Syntuza , [place], [place] all are not on his formulary.  We will continue to follow to see if copay assistance is needed.  Clearance Coots, CPhT Specialty Pharmacy Patient [facility] for Infectious Disease Phone: [phone] Fax:  [phone]

## 2020-08-14 ENCOUNTER — Ambulatory Visit: Payer: Self-pay | Admitting: Internal Medicine

## 2020-08-14 ENCOUNTER — Ambulatory Visit: Payer: Self-pay

## 2020-08-20 ENCOUNTER — Telehealth (HOSPITAL_BASED_OUTPATIENT_CLINIC_OR_DEPARTMENT_OTHER): Payer: Self-pay | Admitting: Emergency Medicine

## 2020-08-20 ENCOUNTER — Telehealth: Payer: Self-pay

## 2020-08-20 NOTE — Telephone Encounter (Signed)
[facility] Department for confirming labs. Spoke with Corrie Dandy, RN who will reach out to HIV/STD nurse for results. Juanita Laster, RMA

## 2020-08-20 NOTE — Telephone Encounter (Signed)
Received confirmatory testing from health department. Labs placed in provider's box.   [name], RN

## 2020-08-20 NOTE — Telephone Encounter (Signed)
Have not received adequate labs regarding confirmatory testing. Patient to see Dr. Luciana Axe tomorrow 08/21/20 to discuss labs and prevention.   [name], RN

## 2020-08-21 ENCOUNTER — Other Ambulatory Visit (HOSPITAL_COMMUNITY): Payer: Self-pay

## 2020-08-21 ENCOUNTER — Encounter: Payer: Self-pay | Admitting: Internal Medicine

## 2020-08-21 ENCOUNTER — Other Ambulatory Visit: Payer: Self-pay

## 2020-08-21 ENCOUNTER — Ambulatory Visit: Payer: Self-pay

## 2020-08-21 ENCOUNTER — Ambulatory Visit (INDEPENDENT_AMBULATORY_CARE_PROVIDER_SITE_OTHER): Payer: Self-pay | Admitting: Internal Medicine

## 2020-08-21 VITALS — BP 113/72 | HR 74 | Temp 98.2°F | Ht 68.0 in | Wt 175.0 lb

## 2020-08-21 DIAGNOSIS — Z79899 Other long term (current) drug therapy: Secondary | ICD-10-CM

## 2020-08-21 DIAGNOSIS — Z113 Encounter for screening for infections with a predominantly sexual mode of transmission: Secondary | ICD-10-CM

## 2020-08-21 DIAGNOSIS — Z23 Encounter for immunization: Secondary | ICD-10-CM

## 2020-08-21 DIAGNOSIS — B20 Human immunodeficiency virus [HIV] disease: Secondary | ICD-10-CM

## 2020-08-21 MED ORDER — BICTEGRAVIR-EMTRICITAB-TENOFOV 50-200-25 MG PO TABS: 1.0000 | ORAL_TABLET | Freq: Every day | ORAL | 11 refills | Status: DC

## 2020-08-21 NOTE — Progress Notes (Signed)
Patient ID: Daniel [name], male    DOB: [birth year], 30 y.o.   MRN: [id number]  Reason for visit: to establish care as a new patient with HIV  HPI:   Patient was first diagnosed last month by the HD and during an ED visit.  He was tested as part risk factor screening with positive STI symptoms.  He is here for his first visit and no labs done yet prior to the visit. He endorses heterosexual contact only.  History of gonorrhea.  No history of syphilis, chlamydia.  Here with his pregnant wife.  She has tested negative.  She is aware of PrEP.  He has no associated symptoms including no rash, no diarrhea, no weight loss.    Past Medical History:  Diagnosis Date  . ADHD (attention deficit hyperactivity disorder)   . Alcohol consumption binge drinking   . Asthma   . Bipolar disorder (HCC)     Prior to Admission medications   Medication Sig Start Date End Date Taking? Authorizing Provider  albuterol (PROVENTIL HFA;VENTOLIN HFA) 108 (90 Base) MCG/ACT inhaler Inhale 1-2 puffs into the lungs every 6 (six) hours as needed for wheezing or shortness of breath. 08/04/16   Armandina Stammer I, NP  clotrimazole-betamethasone (LOTRISONE) cream Apply to affected area 2 times daily prn 04/24/18   Molpus, Jonny Ruiz, MD    Allergies  Allergen Reactions  . Iodine Rash  . Shellfish Allergy Rash    Social History   Tobacco Use  . Smoking status: Never Smoker  . Smokeless tobacco: Never Used  Vaping Use  . Vaping Use: Every day  Substance Use Topics  . Alcohol use: Yes    Comment: socially   . Drug use: Yes    Types: Marijuana    Family History  Problem Relation Age of Onset  . Breast cancer Mother   . Depression Mother   . Drug abuse Mother   . ADD / ADHD Sister   . Bipolar disorder Sister   . Drug abuse Sister   . Suicidality Cousin     Review of Systems Constitutional: negative for fevers, chills, sweats, fatigue, malaise, anorexia and weight loss Respiratory: negative for cough, sputum or  hemoptysis Cardiovascular: negative for fatigue Gastrointestinal: negative for nausea, vomiting and diarrhea Genitourinary: negative for dysuria and genital lesions and penile discharge Integument/breast: negative for rash Hematologic/lymphatic: negative for lymphadenopathy Musculoskeletal: negative for myalgias and arthralgias Neurological: negative for headaches and dizziness Behavioral/Psych: negative for anxiety All other systems reviewed and are negative    CONSTITUTIONAL:in no apparent distress There were no vitals filed for this visit. EYES: anicteric HENT: no thrush CARD:Cor RRR RESP:clear AS:NKNLZ sounds are normal, liver is not enlarged, spleen is not enlarged MS:no pedal edema noted SKIN:no rashes NEURO: non-focal  No results found for: HIV1RNAQUANT No components found for: HIV1GENOTYPRPLUS No components found for: THELPERCELL  Assessment: new patient here with HIV.  Discussed with patient treatment options and side effects, benefits of treatment, long term outcomes.  I discussed the severity of untreated HIV including higher cancer risk, opportunistic infections, renal failure.  Also discussed needing to use condoms, partner disclosure, necessary vaccines, blood monitoring.  All questions answered.   Will do rapid start prior to labs with [facility] and will give the medications for 4 weeks today and he will return in 4 weeks with one of my partners to check and repeat labs then on medication.    Plan: 1) labs today 2) start Biktarvy today - provided 3) financial paperwork 4)  PCV -13 and Menveo  ADDENDUM: patient apparanetly does have insurance so will need to do verification on Monday so will hold off on doing labs today until Monday.  He will go ahead and start the [place] regardless.

## 2020-08-24 ENCOUNTER — Telehealth: Payer: Self-pay

## 2020-08-24 NOTE — Telephone Encounter (Signed)
Patient called to let us know that his insurance coverage with [employer] will terminate tonight at midnight. He will need assistance with ADAP application. Financial team advises they will call patient tomorrow.   [name], RN

## 2020-08-26 ENCOUNTER — Telehealth: Payer: Self-pay

## 2020-08-26 NOTE — Telephone Encounter (Signed)
Received call from patient, he recently started Biktarvy. States he has been having headaches, sneezing, runny nose, and sinus congestion for 2-3 days. Patient states he's taken two rapid COVID tests and they've both been negative.   [name], RN

## 2020-08-27 NOTE — Telephone Encounter (Signed)
Called patient. It sounds like he has a common cold or viral URI. Headaches could be due to [facility] but with his sinus congestion, I would tend to believe it is due to that. His COVID tests have been negative. No cough, fever, or sore throat. Advised him to get some OTC Claritin and Sudafed to see if that helps with his runny nose and sinus congestion. He will get today. Answered all questions. He will call the clinic back if his symptoms don't resolve after a few weeks (advised him that it could take this long to clear a viral URI).

## 2020-09-02 ENCOUNTER — Encounter: Payer: Self-pay | Admitting: Infectious Diseases

## 2020-09-04 ENCOUNTER — Ambulatory Visit: Payer: Self-pay | Admitting: Internal Medicine

## 2020-09-04 ENCOUNTER — Other Ambulatory Visit: Payer: Self-pay

## 2020-09-04 ENCOUNTER — Other Ambulatory Visit: Payer: Self-pay | Admitting: Internal Medicine

## 2020-09-04 DIAGNOSIS — Z113 Encounter for screening for infections with a predominantly sexual mode of transmission: Secondary | ICD-10-CM

## 2020-09-04 DIAGNOSIS — B20 Human immunodeficiency virus [HIV] disease: Secondary | ICD-10-CM

## 2020-09-04 DIAGNOSIS — Z79899 Other long term (current) drug therapy: Secondary | ICD-10-CM

## 2020-09-11 ENCOUNTER — Emergency Department (HOSPITAL_BASED_OUTPATIENT_CLINIC_OR_DEPARTMENT_OTHER)
Admission: EM | Admit: 2020-09-11 | Discharge: 2020-09-11 | Disposition: A | Discharge status: Home / Self Care | Payer: Self-pay | Attending: Emergency Medicine | Admitting: Emergency Medicine

## 2020-09-11 ENCOUNTER — Telehealth: Payer: Self-pay

## 2020-09-11 ENCOUNTER — Other Ambulatory Visit: Payer: Self-pay

## 2020-09-11 ENCOUNTER — Encounter (HOSPITAL_BASED_OUTPATIENT_CLINIC_OR_DEPARTMENT_OTHER): Payer: Self-pay

## 2020-09-11 DIAGNOSIS — M791 Myalgia, unspecified site: Secondary | ICD-10-CM | POA: Insufficient documentation

## 2020-09-11 DIAGNOSIS — Z5321 Procedure and treatment not carried out due to patient leaving prior to being seen by health care provider: Secondary | ICD-10-CM | POA: Insufficient documentation

## 2020-09-11 DIAGNOSIS — R509 Fever, unspecified: Secondary | ICD-10-CM | POA: Insufficient documentation

## 2020-09-11 DIAGNOSIS — U071 COVID-19: Secondary | ICD-10-CM

## 2020-09-11 NOTE — ED Notes (Signed)
Dr Dalene Seltzer made aware of patient leaving before screening exam.

## 2020-09-11 NOTE — ED Triage Notes (Addendum)
Pt states awoke with fever, body aches, chills.  Tested negative for covid last week.  Took tylenol one hour ago.

## 2020-09-11 NOTE — ED Notes (Signed)
Pt requesting wife to come to room, explained to patient that due to his symptoms, he is on respiratory precautions per policy, and visitors are not permitted.  He states "NO, I'm not here for covid, I won't get tested, I got tested a week go, so I'm leaving if my wife can't be in the room with me"  Explained to patient that he has the right for the provider to provide a medical screening exam, please wait, and the provider will be in shortly.  3 minutes later, pt walked out, asked to return to room to see the provider, said nothing and walked out the door.

## 2020-09-11 NOTE — Telephone Encounter (Signed)
Received call from patient's wife concerned that he tested positive for COVID this morning. She is wondering what they should do and states that an emergency room doctor prescribed him Paxlovid. RN advised her to follow the recommendations of the ED doctor and to monitor the patient and manage his symptoms. Instructed her to take the patient to the emergency room if his symptoms worsen significantly or he develops trouble breathing. Rescheduled patient's appointment according to quarantine guidelines.   [name], RN

## 2020-09-11 NOTE — Telephone Encounter (Signed)
I tried to call him back but he did not answer.  Unfortunately with the long weekend we probably can't get him in timely for monoclonal antibody, and honestly I like paxlovid better because it is free and works just as well.   Looks like he "eloped" prior to being seen by the doctor, hence no rx for it.   I will send him a mychart. His sx started 5/27 so he has time to get started on it this weekend.

## 2020-09-15 ENCOUNTER — Telehealth: Payer: Self-pay | Admitting: *Deleted

## 2020-09-15 NOTE — Telephone Encounter (Signed)
Patient's wife called with concerns. Leory started Paxlovid 5/27-5/28, but threw up once with his dose on 5/28. He noticed bright red blood in his vomit. He stopped Paxlovid because he thought it was not working well with [employer]. He feels much better not taking it. He has not vomited again or noticed any more blood. He only has a cough with Covid, feels he is improving well enough without restarting Paxlovid. RN advised patient's wife it was ok to stop the Paxlovid at this point, advised them to go to the ER if his symptoms worsen or if he starts vomiting/sees blood again, notices coffee-ground stools, or if he is unable to eat/drink. She verbalized understanding. She said that is currently dealing with covid symptoms herself and is isolating from him. Andree Coss, RN

## 2020-09-16 LAB — T-HELPER CELLS (CD4) COUNT (NOT AT ARMC)
Absolute CD4: 338 cells/uL — ABNORMAL LOW (ref 490–1740)
CD4 T Helper %: 24 % — ABNORMAL LOW (ref 30–61)
Total lymphocyte count: 1386 cells/uL (ref 850–3900)

## 2020-09-16 LAB — HIV-1/2 AB - DIFFERENTIATION
HIV-1 antibody: POSITIVE — AB
HIV-2 Ab: NEGATIVE

## 2020-09-16 LAB — CBC WITH DIFFERENTIAL/PLATELET
Absolute Monocytes: 288 cells/uL (ref 200–950)
Basophils Absolute: 20 cells/uL (ref 0–200)
Basophils Relative: 0.5 %
Eosinophils Absolute: 68 cells/uL (ref 15–500)
Eosinophils Relative: 1.7 %
HCT: 44.6 % (ref 38.5–50.0)
Hemoglobin: 14.2 g/dL (ref 13.2–17.1)
Lymphs Abs: 1436 cells/uL (ref 850–3900)
MCH: 27.5 pg (ref 27.0–33.0)
MCHC: 31.8 g/dL — ABNORMAL LOW (ref 32.0–36.0)
MCV: 86.4 fL (ref 80.0–100.0)
MPV: 10.3 fL (ref 7.5–12.5)
Monocytes Relative: 7.2 %
Neutro Abs: 2188 cells/uL (ref 1500–7800)
Neutrophils Relative %: 54.7 %
Platelets: 212 10*3/uL (ref 140–400)
RBC: 5.16 10*6/uL (ref 4.20–5.80)
RDW: 13.7 % (ref 11.0–15.0)
Total Lymphocyte: 35.9 %
WBC: 4 10*3/uL (ref 3.8–10.8)

## 2020-09-16 LAB — COMPLETE METABOLIC PANEL WITH GFR
AG Ratio: 1.6 (calc) (ref 1.0–2.5)
ALT: 12 U/L (ref 9–46)
AST: 15 U/L (ref 10–40)
Albumin: 4.5 g/dL (ref 3.6–5.1)
Alkaline phosphatase (APISO): 36 U/L (ref 36–130)
BUN: 10 mg/dL (ref 7–25)
CO2: 27 mmol/L (ref 20–32)
Calcium: 9.5 mg/dL (ref 8.6–10.3)
Chloride: 104 mmol/L (ref 98–110)
Creat: 1.11 mg/dL (ref 0.60–1.35)
GFR, Est African American: 103 mL/min/{1.73_m2} (ref >=60)
GFR, Est Non African American: 89 mL/min/{1.73_m2} (ref >=60)
Globulin: 2.8 g/dL (calc) (ref 1.9–3.7)
Glucose, Bld: 122 mg/dL — ABNORMAL HIGH (ref 65–99)
Potassium: 4 mmol/L (ref 3.5–5.3)
Sodium: 138 mmol/L (ref 135–146)
Total Bilirubin: 0.6 mg/dL (ref 0.2–1.2)
Total Protein: 7.3 g/dL (ref 6.1–8.1)

## 2020-09-16 LAB — HIV-1 GENOTYPING (RTI,PI,IN INHBTR)
Date Viral Load Collected: 5202022
HIV-1 Genotype: NOT DETECTED

## 2020-09-16 LAB — HEPATITIS A ANTIBODY, TOTAL: Hepatitis A AB,Total: NONREACTIVE

## 2020-09-16 LAB — LIPID PANEL
Cholesterol: 142 mg/dL (ref <200)
HDL: 47 mg/dL (ref >=40)
LDL Cholesterol (Calc): 80 mg/dL (calc)
Non-HDL Cholesterol (Calc): 95 mg/dL (calc) (ref <130)
Total CHOL/HDL Ratio: 3 (calc) (ref <5.0)
Triglycerides: 74 mg/dL (ref <150)

## 2020-09-16 LAB — HEPATITIS B SURFACE ANTIGEN: Hepatitis B Surface Ag: NONREACTIVE

## 2020-09-16 LAB — HIV ANTIBODY (ROUTINE TESTING W REFLEX): HIV 1&2 Ab, 4th Generation: REACTIVE — AB

## 2020-09-16 LAB — HEPATITIS B CORE ANTIBODY, TOTAL: Hep B Core Total Ab: NONREACTIVE

## 2020-09-16 LAB — HIV-1 RNA QUANT-NO REFLEX-BLD
HIV 1 RNA Quant: 32 Copies/mL — ABNORMAL HIGH
HIV-1 RNA Quant, Log: 1.51 Log cps/mL — ABNORMAL HIGH

## 2020-09-16 LAB — HEPATITIS B SURFACE ANTIBODY,QUALITATIVE: Hep B S Ab: BORDERLINE — AB

## 2020-09-16 LAB — HEPATITIS C ANTIBODY
Hepatitis C Ab: NONREACTIVE
SIGNAL TO CUT-OFF: 0.02 (ref <1.00)

## 2020-09-16 LAB — RPR: RPR Ser Ql: NONREACTIVE

## 2020-09-18 ENCOUNTER — Ambulatory Visit: Payer: Self-pay | Admitting: Infectious Diseases

## 2020-09-21 ENCOUNTER — Ambulatory Visit (INDEPENDENT_AMBULATORY_CARE_PROVIDER_SITE_OTHER): Payer: Self-pay | Admitting: Infectious Diseases

## 2020-09-21 ENCOUNTER — Other Ambulatory Visit: Payer: Self-pay

## 2020-09-21 ENCOUNTER — Encounter: Payer: Self-pay | Admitting: Infectious Diseases

## 2020-09-21 VITALS — BP 127/74 | HR 59 | Temp 98.3°F | Wt 171.0 lb

## 2020-09-21 DIAGNOSIS — B20 Human immunodeficiency virus [HIV] disease: Secondary | ICD-10-CM

## 2020-09-21 DIAGNOSIS — Z21 Asymptomatic human immunodeficiency virus [HIV] infection status: Secondary | ICD-10-CM

## 2020-09-21 DIAGNOSIS — Z23 Encounter for immunization: Secondary | ICD-10-CM

## 2020-09-21 NOTE — Progress Notes (Signed)
Name: Daniel [name]  DOB: 12/01/1990 MRN: [id number] PCP: Patient, No Pcp Per (Inactive)     Subjective:  CC: FU HIV care  Recovered from COVID and still with a slight cough.     HPI: Daniel [name] is here with his wife today.  I permission to talk about his health in her presence.  He states he is done very well since starting his Biktarvy.  He is completing 1 month and had his second medication shipped to him recently.  Reported that that process is going smoothly.  He has not had any side effects or concerns with his new medication.  He is taking it sporadically in a 24-hour day, not at a consistent time.  He is never forgotten the dose but wondering about any recommendations on how to take it more consistently.  He and his wife have remained abstinent for the last several months.  She tested negative and spoke with her maternal-fetal medicine team regarding once daily prep with Truvada.  Would like to discuss further.  Daniel [name] and his wife both had COVID greater than 2 weeks ago.  They both report some lingering fatigue and a slight cough.  He did try paxlovid but was only able to tolerate 1 day of this that he began having trouble with vomiting.  Both reported only mild symptoms of URI.    Depression screen PHQ 2/9 09/21/2020  Decreased Interest 1  Down, Depressed, Hopeless 1  PHQ - 2 Score 2  Altered sleeping 0  Tired, decreased energy 0  Change in appetite 0  Feeling bad or failure about yourself  0  Trouble concentrating 0  Moving slowly or fidgety/restless 0  Suicidal thoughts 0  PHQ-9 Score 2  Difficult doing work/chores Not difficult at all    Review of Systems  Constitutional: Negative for chills, fever, malaise/fatigue and weight loss.  HENT: Negative for sore throat.   Respiratory: Positive for cough (dry ). Negative for sputum production and shortness of breath.   Cardiovascular: Negative.   Gastrointestinal: Negative for abdominal pain, diarrhea and vomiting.   Musculoskeletal: Negative for joint pain, myalgias and neck pain.  Skin: Negative for rash.  Neurological: Negative for headaches.  Psychiatric/Behavioral: Negative for depression and substance abuse. The patient is not nervous/anxious.     Past Medical History:  Diagnosis Date  . ADHD (attention deficit hyperactivity disorder)   . Alcohol consumption binge drinking   . Asthma   . Bipolar disorder [facility])     Outpatient Medications Prior to Visit  Medication Sig Dispense Refill  . bictegravir-emtricitabine-tenofovir AF (BIKTARVY) 50-200-25 MG TABS tablet Take 1 tablet by mouth daily. 30 tablet 11  . albuterol (PROVENTIL HFA;VENTOLIN HFA) 108 (90 Base) MCG/ACT inhaler Inhale 1-2 puffs into the lungs every 6 (six) hours as needed for wheezing or shortness of breath. (Patient not taking: Reported on 08/21/2020)     No facility-administered medications prior to visit.     Allergies  Allergen Reactions  . Iodine Rash  . Shellfish Allergy Rash    Social History   Tobacco Use  . Smoking status: Current Every Day Smoker    Types: E-cigarettes  . Smokeless tobacco: Never Used  . Tobacco comment: patient vapes   Vaping Use  . Vaping Use: Every day  Substance Use Topics  . Alcohol use: Yes    Comment: socially   . Drug use: Yes    Types: Marijuana    Family History  Problem Relation Age of Onset  . Breast  cancer Mother   . Depression Mother   . Drug abuse Mother   . ADD / ADHD Sister   . Bipolar disorder Sister   . Drug abuse Sister   . Suicidality Cousin     Social History   Substance and Sexual Activity  Sexual Activity Yes   Comment: 1 partner x 10 mon     Objective:   Vitals:   09/21/20 0934  BP: 127/74  Pulse: (!) 59  Temp: 98.3 F (36.8 C)  TempSrc: Oral  Weight: 171 lb (77.6 kg)   Body mass index is 26 kg/m.  Physical Exam Vitals reviewed.  Constitutional:      Appearance: He is well-developed.     Comments: Seated comfortably in chair during  visit.   HENT:     Mouth/Throat:     Dentition: Normal dentition. No dental abscesses.  Cardiovascular:     Rate and Rhythm: Normal rate and regular rhythm.     Heart sounds: Normal heart sounds.  Pulmonary:     Effort: Pulmonary effort is normal.     Breath sounds: Normal breath sounds.  Abdominal:     General: There is no distension.     Palpations: Abdomen is soft.     Tenderness: There is no abdominal tenderness.  Lymphadenopathy:     Cervical: No cervical adenopathy.  Skin:    General: Skin is warm and dry.     Findings: No rash.  Neurological:     Mental Status: He is alert and oriented to person, place, and time.  Psychiatric:        Judgment: Judgment normal.     Comments: In good spirits today and engaged in care discussion.      Lab Results Lab Results  Component Value Date   WBC 4.0 09/04/2020   HGB 14.2 09/04/2020   HCT 44.6 09/04/2020   MCV 86.4 09/04/2020   PLT 212 09/04/2020    Lab Results  Component Value Date   CREATININE 1.11 09/04/2020   BUN 10 09/04/2020   NA 138 09/04/2020   K 4.0 09/04/2020   CL 104 09/04/2020   CO2 27 09/04/2020    Lab Results  Component Value Date   ALT 12 09/04/2020   AST 15 09/04/2020   ALKPHOS 35 (L) 07/29/2016   BILITOT 0.6 09/04/2020    Lab Results  Component Value Date   CHOL 142 09/04/2020   HDL 47 09/04/2020   LDLCALC 80 09/04/2020   TRIG 74 09/04/2020   CHOLHDL 3.0 09/04/2020   HIV 1 RNA Quant (Copies/mL)  Date Value  09/04/2020 32 (H)     Assessment & Plan:   Problem List Items Addressed This Visit      Unprioritized   Human immunodeficiency virus (HIV) disease (HCC)    We reviewed all of his intake lab work today.  His viral load was only 32 a few weeks back.  I am not sure if this was his initial intake viral load making him an elite controller or if he was already established on his medication.  I feel it was the latter.  Nonetheless we will repeat blood work today for therapeutic  monitoring. CD4 count preserved greater than 300.  No findings on exam concerning for opportunistic infection.  No need for prophylactic antibiotics at this time. We will give him his hepatitis A vaccine first dose today.  He will return to see Dr. Luciana Axe in 3 months for his Pneumovax, second Menveo and to discuss  hep B booster timing.   We talked about picking of the consistent timeframe he can remember to take his medication.  Sounds like it will be sometime around dinner to bedtime.  Given his wife is currently pregnant I am not sure that we can fully offer good confidence that Truvada will work to prevent HIV as it did in the studies, recommended abstinence at this time versus strict condom use.  Will discuss further with Cassie and our pharmacy team.  She would be interested to take Truvada for PrEP after the current pregnancy.       Other Visit Diagnoses    Asymptomatic HIV infection, with no history of HIV-related illness (HCC)    -  Primary   Relevant Orders   QuantiFERON-TB Gold Plus   HIV 1 RNA quant-no reflex-bld   HIV 1 RNA quant-no reflex-bld   T-helper cell (CD4)- ([facility] clinic only)   Need for hepatitis A vaccination       Relevant Orders   Hepatitis A vaccine adult IM (Completed)      Rexene Alberts, MSN, NP-C [facility] Pager: [phone] Office: [phone]  09/21/20  10:24 AM

## 2020-09-21 NOTE — Assessment & Plan Note (Signed)
We reviewed all of his intake lab work today.  His viral load was only 32 a few weeks back.  I am not sure if this was his initial intake viral load making him an elite controller or if he was already established on his medication.  I feel it was the latter.  Nonetheless we will repeat blood work today for therapeutic monitoring. CD4 count preserved greater than 300.  No findings on exam concerning for opportunistic infection.  No need for prophylactic antibiotics at this time. We will give him his hepatitis A vaccine first dose today.  He will return to see Dr. Luciana Axe in 3 months for his Pneumovax, second Menveo and to discuss hep B booster timing.   We talked about picking of the consistent timeframe he can remember to take his medication.  Sounds like it will be sometime around dinner to bedtime.  Given his wife is currently pregnant I am not sure that we can fully offer good confidence that Truvada will work to prevent HIV as it did in the studies, recommended abstinence at this time versus strict condom use.  Will discuss further with Cassie and our pharmacy team.  She would be interested to take Truvada for PrEP after the current pregnancy.

## 2020-09-21 NOTE — Patient Instructions (Signed)
We gave you your first hepatitis A vaccine today - you will need your second in 6-12 months.   Please come back to see [place] at your next appointment in 3 months with Dr. Luciana Axe.  Will have you repeat your blood work 2 weeks before your next appointment so they are available for review at the time of your visit.   Keep up the great work taking your biktarvy everyday.  Stop by the lab on your way out today please.

## 2020-09-23 LAB — QUANTIFERON-TB GOLD PLUS
Mitogen-NIL: >10 IU/mL
NIL: 0.07 IU/mL
QuantiFERON-TB Gold Plus: NEGATIVE
TB1-NIL: 0.01 IU/mL
TB2-NIL: 0.03 IU/mL

## 2020-09-23 LAB — HIV-1 RNA QUANT-NO REFLEX-BLD
HIV 1 RNA Quant: NOT DETECTED Copies/mL
HIV-1 RNA Quant, Log: NOT DETECTED Log cps/mL

## 2020-11-30 ENCOUNTER — Ambulatory Visit: Payer: Self-pay

## 2020-11-30 ENCOUNTER — Other Ambulatory Visit: Payer: Self-pay

## 2020-12-22 ENCOUNTER — Other Ambulatory Visit: Payer: Self-pay

## 2020-12-22 ENCOUNTER — Encounter: Payer: Self-pay | Admitting: Internal Medicine

## 2020-12-22 DIAGNOSIS — B20 Human immunodeficiency virus [HIV] disease: Secondary | ICD-10-CM

## 2020-12-22 DIAGNOSIS — Z21 Asymptomatic human immunodeficiency virus [HIV] infection status: Secondary | ICD-10-CM

## 2020-12-23 LAB — T-HELPER CELL (CD4) - (RCID CLINIC ONLY)
CD4 % Helper T Cell: 28 % — ABNORMAL LOW (ref 33–65)
CD4 T Cell Abs: 366 /uL — ABNORMAL LOW (ref 400–1790)

## 2020-12-25 LAB — QUANTIFERON-TB GOLD PLUS
Mitogen-NIL: >10 IU/mL
NIL: 0.1 IU/mL
QuantiFERON-TB Gold Plus: NEGATIVE
TB1-NIL: 0 IU/mL
TB2-NIL: <0 IU/mL

## 2020-12-25 LAB — HIV-1 RNA QUANT-NO REFLEX-BLD
HIV 1 RNA Quant: NOT DETECTED Copies/mL
HIV-1 RNA Quant, Log: NOT DETECTED Log cps/mL

## 2021-01-06 ENCOUNTER — Other Ambulatory Visit: Payer: Self-pay

## 2021-01-06 ENCOUNTER — Encounter: Payer: Self-pay | Admitting: Internal Medicine

## 2021-01-06 ENCOUNTER — Ambulatory Visit (INDEPENDENT_AMBULATORY_CARE_PROVIDER_SITE_OTHER): Payer: Self-pay | Admitting: Internal Medicine

## 2021-01-06 VITALS — BP 116/80 | HR 96 | Temp 98.3°F | Ht 68.0 in | Wt 173.0 lb

## 2021-01-06 DIAGNOSIS — Z113 Encounter for screening for infections with a predominantly sexual mode of transmission: Secondary | ICD-10-CM

## 2021-01-06 DIAGNOSIS — B20 Human immunodeficiency virus [HIV] disease: Secondary | ICD-10-CM

## 2021-01-06 DIAGNOSIS — Z23 Encounter for immunization: Secondary | ICD-10-CM | POA: Insufficient documentation

## 2021-01-06 DIAGNOSIS — Z79899 Other long term (current) drug therapy: Secondary | ICD-10-CM

## 2021-01-06 NOTE — Assessment & Plan Note (Signed)
Discussed hepatitis A vaccine and #2 given today

## 2021-01-06 NOTE — Assessment & Plan Note (Addendum)
Doing well, no changes and he can rtc in 4 months.   Will do Menveo #2 next visit

## 2021-01-06 NOTE — Addendum Note (Signed)
Addended by: Linna Hoff D on: 01/06/2021 12:14 PM   Modules accepted: Orders

## 2021-01-06 NOTE — Progress Notes (Signed)
   Subjective:    Patient ID: Daniel [name], male    DOB: 1990-09-30, 30 y.o.   MRN: [id number]  HPI Here for follow up of HIV He continues on Biktarvy with no missed doses.  No issues with getting, taking or tolerating the medication.  No concerns today.  CD4 366 and viral load not detected.  His baby girl is due on October 3rd.  Also with 29 and 31 year old boys.     Review of Systems  Constitutional:  Negative for fatigue.  Gastrointestinal:  Negative for diarrhea and nausea.  Skin:  Negative for rash.      Objective:   Physical Exam Eyes:     General: No scleral icterus. Pulmonary:     Effort: Pulmonary effort is normal.  Skin:    Findings: No rash.  Neurological:     General: No focal deficit present.     Mental Status: He is alert.  Psychiatric:        Mood and Affect: Mood normal.    SH: no tobacco      Assessment & Plan:

## 2021-01-06 NOTE — Assessment & Plan Note (Signed)
Discussed the flu shot and given today 

## 2021-05-10 ENCOUNTER — Other Ambulatory Visit: Payer: Self-pay

## 2021-05-10 DIAGNOSIS — Z79899 Other long term (current) drug therapy: Secondary | ICD-10-CM

## 2021-05-10 DIAGNOSIS — Z113 Encounter for screening for infections with a predominantly sexual mode of transmission: Secondary | ICD-10-CM

## 2021-05-10 DIAGNOSIS — B20 Human immunodeficiency virus [HIV] disease: Secondary | ICD-10-CM

## 2021-05-11 LAB — URINE CYTOLOGY ANCILLARY ONLY
Chlamydia: NEGATIVE
Comment: NEGATIVE
Comment: NORMAL
Neisseria Gonorrhea: NEGATIVE

## 2021-05-11 LAB — T-HELPER CELL (CD4) - (RCID CLINIC ONLY)
CD4 % Helper T Cell: 25 % — ABNORMAL LOW (ref 33–65)
CD4 T Cell Abs: 504 /uL (ref 400–1790)

## 2021-05-12 LAB — URINALYSIS, ROUTINE W REFLEX MICROSCOPIC
Bilirubin Urine: NEGATIVE
Glucose, UA: NEGATIVE
Hgb urine dipstick: NEGATIVE
Ketones, ur: NEGATIVE
Leukocytes,Ua: NEGATIVE
Nitrite: NEGATIVE
Protein, ur: NEGATIVE
Specific Gravity, Urine: 1.025 (ref 1.001–1.035)
pH: 6.5 (ref 5.0–8.0)

## 2021-05-12 LAB — LIPID PANEL
Cholesterol: 156 mg/dL (ref <200)
HDL: 47 mg/dL (ref >=40)
LDL Cholesterol (Calc): 91 mg/dL (calc)
Non-HDL Cholesterol (Calc): 109 mg/dL (calc) (ref <130)
Total CHOL/HDL Ratio: 3.3 (calc) (ref <5.0)
Triglycerides: 85 mg/dL (ref <150)

## 2021-05-12 LAB — COMPLETE METABOLIC PANEL WITH GFR
AG Ratio: 1.6 (calc) (ref 1.0–2.5)
ALT: 12 U/L (ref 9–46)
AST: 16 U/L (ref 10–40)
Albumin: 4.8 g/dL (ref 3.6–5.1)
Alkaline phosphatase (APISO): 35 U/L — ABNORMAL LOW (ref 36–130)
BUN: 15 mg/dL (ref 7–25)
CO2: 28 mmol/L (ref 20–32)
Calcium: 9.8 mg/dL (ref 8.6–10.3)
Chloride: 105 mmol/L (ref 98–110)
Creat: 1.13 mg/dL (ref 0.60–1.26)
Globulin: 3 g/dL (calc) (ref 1.9–3.7)
Glucose, Bld: 93 mg/dL (ref 65–99)
Potassium: 3.9 mmol/L (ref 3.5–5.3)
Sodium: 138 mmol/L (ref 135–146)
Total Bilirubin: 0.5 mg/dL (ref 0.2–1.2)
Total Protein: 7.8 g/dL (ref 6.1–8.1)
eGFR: 89 mL/min/{1.73_m2} (ref >=60)

## 2021-05-12 LAB — HIV-1 RNA QUANT-NO REFLEX-BLD
HIV 1 RNA Quant: NOT DETECTED Copies/mL
HIV-1 RNA Quant, Log: NOT DETECTED Log cps/mL

## 2021-05-12 LAB — CBC WITH DIFFERENTIAL/PLATELET
Absolute Monocytes: 630 cells/uL (ref 200–950)
Basophils Absolute: 23 cells/uL (ref 0–200)
Basophils Relative: 0.3 %
Eosinophils Absolute: 60 cells/uL (ref 15–500)
Eosinophils Relative: 0.8 %
HCT: 45.4 % (ref 38.5–50.0)
Hemoglobin: 14.5 g/dL (ref 13.2–17.1)
Lymphs Abs: 2205 cells/uL (ref 850–3900)
MCH: 27.3 pg (ref 27.0–33.0)
MCHC: 31.9 g/dL — ABNORMAL LOW (ref 32.0–36.0)
MCV: 85.5 fL (ref 80.0–100.0)
MPV: 10.3 fL (ref 7.5–12.5)
Monocytes Relative: 8.4 %
Neutro Abs: 4583 cells/uL (ref 1500–7800)
Neutrophils Relative %: 61.1 %
Platelets: 246 10*3/uL (ref 140–400)
RBC: 5.31 10*6/uL (ref 4.20–5.80)
RDW: 13.1 % (ref 11.0–15.0)
Total Lymphocyte: 29.4 %
WBC: 7.5 10*3/uL (ref 3.8–10.8)

## 2021-05-12 LAB — RPR: RPR Ser Ql: NONREACTIVE

## 2021-06-01 ENCOUNTER — Ambulatory Visit: Payer: Self-pay

## 2021-06-01 ENCOUNTER — Encounter: Payer: Self-pay | Admitting: Internal Medicine

## 2021-06-25 ENCOUNTER — Ambulatory Visit (INDEPENDENT_AMBULATORY_CARE_PROVIDER_SITE_OTHER): Payer: Self-pay | Admitting: Internal Medicine

## 2021-06-25 ENCOUNTER — Other Ambulatory Visit: Payer: Self-pay

## 2021-06-25 ENCOUNTER — Encounter: Payer: Self-pay | Admitting: Internal Medicine

## 2021-06-25 ENCOUNTER — Ambulatory Visit: Payer: Self-pay

## 2021-06-25 VITALS — BP 117/77 | HR 63 | Temp 97.7°F | Ht 68.0 in | Wt 194.0 lb

## 2021-06-25 DIAGNOSIS — B20 Human immunodeficiency virus [HIV] disease: Secondary | ICD-10-CM

## 2021-06-25 DIAGNOSIS — Z79899 Other long term (current) drug therapy: Secondary | ICD-10-CM

## 2021-06-25 DIAGNOSIS — Z23 Encounter for immunization: Secondary | ICD-10-CM

## 2021-06-25 DIAGNOSIS — Z113 Encounter for screening for infections with a predominantly sexual mode of transmission: Secondary | ICD-10-CM

## 2021-06-25 NOTE — Progress Notes (Signed)
? ?  Subjective:  ? ? Patient ID: Daniel [name], male    DOB: 1990-04-22, 31 y.o.   MRN: [id number] ? ?HPI ?Here for follow up of HIV ?He continues on [place] and denies any missed doses.  CD4 of 504 and viral load not detected.  He has had no issues with getting, taking or tolerating his medication.  Recently had a baby daughter.   ? ? ?Review of Systems  ?Constitutional:  Negative for fatigue.  ?Gastrointestinal:  Negative for diarrhea and nausea.  ?Skin:  Negative for rash.  ? ?   ?Objective:  ? Physical Exam ?Eyes:  ?   General: No scleral icterus. ?Pulmonary:  ?   Effort: Pulmonary effort is normal.  ?Skin: ?   Findings: No rash.  ?Neurological:  ?   Mental Status: He is alert.  ?Psychiatric:     ?   Mood and Affect: Mood normal.  ? ?SH: + tobacco ? ? ? ?   ?Assessment & Plan:  ? ? ?

## 2021-06-25 NOTE — Assessment & Plan Note (Signed)
I discussed Menveo and #2 given today ?

## 2021-06-25 NOTE — Assessment & Plan Note (Signed)
Lipid panel noted and no concerns.  

## 2021-06-25 NOTE — Addendum Note (Signed)
Addended by: Wyvonne Lenz on: 06/25/2021 04:27 PM ? ? Modules accepted: Orders ? ?

## 2021-06-25 NOTE — Assessment & Plan Note (Signed)
He continues to do well on Biktarvy and no concerns with labs or tolerance.  No changes indicated and he will rtc in 6 months.  ?

## 2021-06-25 NOTE — Assessment & Plan Note (Signed)
Screened negative 

## 2021-06-28 ENCOUNTER — Encounter: Payer: Self-pay | Admitting: Internal Medicine

## 2021-09-01 ENCOUNTER — Other Ambulatory Visit: Payer: Self-pay | Admitting: Internal Medicine

## 2021-10-28 ENCOUNTER — Ambulatory Visit: Payer: Self-pay

## 2021-10-29 ENCOUNTER — Ambulatory Visit: Payer: Self-pay

## 2021-10-29 ENCOUNTER — Other Ambulatory Visit: Payer: Self-pay

## 2021-12-08 ENCOUNTER — Encounter: Payer: Self-pay | Admitting: Internal Medicine

## 2021-12-09 ENCOUNTER — Encounter: Payer: Self-pay | Admitting: Internal Medicine

## 2021-12-29 ENCOUNTER — Ambulatory Visit (INDEPENDENT_AMBULATORY_CARE_PROVIDER_SITE_OTHER): Payer: Self-pay | Admitting: Internal Medicine

## 2021-12-29 ENCOUNTER — Encounter: Payer: Self-pay | Admitting: Internal Medicine

## 2021-12-29 ENCOUNTER — Other Ambulatory Visit: Payer: Self-pay

## 2021-12-29 VITALS — BP 118/74 | HR 78 | Temp 97.8°F | Wt 198.0 lb

## 2021-12-29 DIAGNOSIS — B20 Human immunodeficiency virus [HIV] disease: Secondary | ICD-10-CM

## 2021-12-29 DIAGNOSIS — Z720 Tobacco use: Secondary | ICD-10-CM

## 2021-12-29 NOTE — Progress Notes (Signed)
   Subjective:    Patient ID: Daniel [name], male    DOB: 1990-07-29, 31 y.o.   MRN: [id number]  HPI Here for follow up of HIV He continues on Biktarvy with no missed doses.  He has no issues with getting, taking or tolerating his medication.  No complaints today.  Baby daughter is doing well after surgeries and treatments.     Review of Systems  Constitutional:  Negative for fatigue.  Gastrointestinal:  Negative for diarrhea.  Skin:  Negative for rash.       Objective:   Physical Exam Eyes:     General: No scleral icterus. Pulmonary:     Effort: Pulmonary effort is normal.  Neurological:     Mental Status: He is alert.  Psychiatric:        Mood and Affect: Mood normal.           Assessment & Plan:

## 2021-12-29 NOTE — Assessment & Plan Note (Signed)
Discussed cessation 

## 2021-12-29 NOTE — Assessment & Plan Note (Signed)
Doing well on Biktarvy and no concerns.  No changes indicated and will get labs today and he can otherwise follow up in 6 months.

## 2021-12-30 LAB — T-HELPER CELL (CD4) - (RCID CLINIC ONLY)
CD4 % Helper T Cell: 37 % (ref 33–65)
CD4 T Cell Abs: 585 /uL (ref 400–1790)

## 2022-01-01 LAB — HIV-1 RNA QUANT-NO REFLEX-BLD
HIV 1 RNA Quant: NOT DETECTED Copies/mL
HIV-1 RNA Quant, Log: NOT DETECTED Log cps/mL

## 2022-03-14 ENCOUNTER — Other Ambulatory Visit: Payer: Self-pay | Admitting: Internal Medicine

## 2022-03-14 ENCOUNTER — Encounter: Payer: Self-pay | Admitting: Internal Medicine

## 2022-06-29 ENCOUNTER — Ambulatory Visit: Payer: Self-pay | Admitting: Internal Medicine

## 2022-07-08 ENCOUNTER — Encounter: Payer: Self-pay | Admitting: Internal Medicine

## 2022-07-14 ENCOUNTER — Ambulatory Visit: Payer: Self-pay | Admitting: Internal Medicine

## 2022-07-25 ENCOUNTER — Ambulatory Visit (INDEPENDENT_AMBULATORY_CARE_PROVIDER_SITE_OTHER): Payer: Self-pay | Admitting: Internal Medicine

## 2022-07-25 ENCOUNTER — Encounter: Payer: Self-pay | Admitting: Internal Medicine

## 2022-07-25 ENCOUNTER — Other Ambulatory Visit: Payer: Self-pay

## 2022-07-25 VITALS — BP 124/80 | HR 98 | Temp 98.7°F | Ht 68.0 in | Wt 195.0 lb

## 2022-07-25 DIAGNOSIS — B20 Human immunodeficiency virus [HIV] disease: Secondary | ICD-10-CM

## 2022-07-25 DIAGNOSIS — Z113 Encounter for screening for infections with a predominantly sexual mode of transmission: Secondary | ICD-10-CM

## 2022-07-25 DIAGNOSIS — Z79899 Other long term (current) drug therapy: Secondary | ICD-10-CM

## 2022-07-25 LAB — COMPLETE METABOLIC PANEL WITH GFR
AST: 12 U/L (ref 10–40)
Alkaline phosphatase (APISO): 37 U/L (ref 36–130)
BUN: 10 mg/dL (ref 7–25)
Globulin: 2.9 g/dL (calc) (ref 1.9–3.7)
Total Bilirubin: 0.5 mg/dL (ref 0.2–1.2)
Total Protein: 7.4 g/dL (ref 6.1–8.1)
eGFR: 94 mL/min/{1.73_m2} (ref >=60)

## 2022-07-25 MED ORDER — BIKTARVY 50-200-25 MG PO TABS: 1.0000 | ORAL_TABLET | Freq: Every day | ORAL | 5 refills | Status: DC

## 2022-07-25 NOTE — Assessment & Plan Note (Signed)
Patient here for HIV follow up normally following with Dr Luciana Axe.  He has good long term control on Biktarvy and was last seen in September 2023.  Doing well today and no concerns.  Will check labs and follow up again in 6 months. Refill sent today.  Also given dental form to see dentistry.

## 2022-07-25 NOTE — Assessment & Plan Note (Signed)
Creatinine stable on Biktarvy and will check today.

## 2022-07-25 NOTE — Assessment & Plan Note (Signed)
Screening offered and declined today.  ?

## 2022-07-25 NOTE — Progress Notes (Signed)
[facility]   CHIEF COMPLAINT    HIV follow up.    SUBJECTIVE:    Daniel [name] is a 32 y.o. male with PMHx as below who presents to the clinic for HIV follow up.   Please see A&P for the details of today's visit and status of the patient's medical problems.   Patient's Medications  New Prescriptions   No medications on file  Previous Medications   No medications on file  Modified Medications   Modified Medication Previous Medication   BICTEGRAVIR-EMTRICITABINE-TENOFOVIR AF (BIKTARVY) 50-200-25 MG TABS TABLET BIKTARVY 50-200-25 MG TABS tablet      Take 1 tablet by mouth daily.    TAKE 1 TABLET BY MOUTH DAILY  Discontinued Medications   No medications on file      Past Medical History:  Diagnosis Date   ADHD (attention deficit hyperactivity disorder)    Alcohol consumption binge drinking    Asthma    Bipolar disorder     Social History   Tobacco Use   Smoking status: Every Day    Types: E-cigarettes   Smokeless tobacco: Never   Tobacco comments:    patient vapes daily, states he is thinking about quitting  Vaping Use   Vaping Use: Every day  Substance Use Topics   Alcohol use: Not Currently    Comment: socially    Drug use: Yes    Types: Marijuana    Comment: states marijuana daily    Family History  Problem Relation Age of Onset   Breast cancer Mother    Depression Mother    Drug abuse Mother    ADD / ADHD Sister    Bipolar disorder Sister    Drug abuse Sister    Suicidality Cousin     Allergies  Allergen Reactions   Iodine Rash   Shellfish Allergy Rash    Review of Systems  Constitutional: Negative.   Gastrointestinal: Negative.      OBJECTIVE:    Vitals:   07/25/22 0944  BP: 124/80  Pulse: 98  Temp: 98.7 F (37.1 C)  TempSrc: Temporal  SpO2: 99%  Weight: 195 lb (88.5 kg)  Height: 5\' 8"  (1.727 m)     Body mass index is 29.65 kg/m.  Physical Exam Constitutional:      Appearance: Normal  appearance.  HENT:     Head: Normocephalic and atraumatic.  Abdominal:     General: There is no distension.  Musculoskeletal:     Cervical back: Normal range of motion and neck supple.  Skin:    General: Skin is warm and dry.  Neurological:     General: No focal deficit present.     Mental Status: He is alert and oriented to person, place, and time.  Psychiatric:        Mood and Affect: Mood normal.        Behavior: Behavior normal.     Labs and Microbiology:    Latest Ref Rng & Units 05/10/2021    8:59 AM 09/04/2020   12:00 AM 03/17/2018   11:40 PM  CMP  Glucose 65 - 99 mg/dL 93  726  95   BUN 7 - 25 mg/dL 15  10  19    Creatinine 0.60 - 1.26 mg/dL 2.03  5.59  7.41   Sodium 135 - 146 mmol/L 138  138  138   Potassium 3.5 - 5.3 mmol/L 3.9  4.0  4.2   Chloride 98 -  110 mmol/L 105  104  105   CO2 20 - 32 mmol/L 28  27  27    Calcium 8.6 - 10.3 mg/dL 9.8  9.5  9.1   Total Protein 6.1 - 8.1 g/dL 7.8  7.3    Total Bilirubin 0.2 - 1.2 mg/dL 0.5  0.6    AST 10 - 40 U/L 16  15    ALT 9 - 46 U/L 12  12        Latest Ref Rng & Units 05/10/2021    8:59 AM 09/04/2020   12:00 AM 03/17/2018   11:40 PM  CBC  WBC 3.8 - 10.8 Thousand/uL 7.5  4.0  4.9   Hemoglobin 13.2 - 17.1 g/dL 82.6  41.5  83.0   Hematocrit 38.5 - 50.0 % 45.4  44.6  43.9   Platelets 140 - 400 Thousand/uL 246  212  239      Lab Results  Component Value Date   HIV1RNAQUANT Not Detected 12/29/2021   HIV1RNAQUANT Not Detected 05/10/2021   HIV1RNAQUANT Not Detected 12/22/2020   CD4TABS 585 12/29/2021   CD4TABS 504 05/10/2021   CD4TABS 366 (L) 12/22/2020    RPR and STI: Lab Results  Component Value Date   LABRPR NON-REACTIVE 05/10/2021   LABRPR NON-REACTIVE 09/04/2020    STI Results GC CT  05/10/2021  8:57 AM Negative  Negative   08/25/2019 12:00 AM Positive  Negative     Hepatitis B: Lab Results  Component Value Date   HEPBSAB BORDERLINE (A) 09/04/2020   HEPBSAG NON-REACTIVE 09/04/2020   HEPBCAB  NON-REACTIVE 09/04/2020   Hepatitis C: Lab Results  Component Value Date   HEPCAB NON-REACTIVE 09/04/2020   Hepatitis A: Lab Results  Component Value Date   HAV NON-REACTIVE 09/04/2020   Lipids: Lab Results  Component Value Date   CHOL 156 05/10/2021   TRIG 85 05/10/2021   HDL 47 05/10/2021   CHOLHDL 3.3 05/10/2021   VLDL 19 07/31/2016   LDLCALC 91 05/10/2021    Imaging:    ASSESSMENT & PLAN:    Human immunodeficiency virus (HIV) disease (HCC) Patient here for HIV follow up normally following with Dr Daniel [name].  He has good long term control on Biktarvy and was last seen in September 2023.  Doing well today and no concerns.  Will check labs and follow up again in 6 months. Refill sent today.  Also given dental form to see dentistry.   Encounter for long-term (current) use of high-risk medication Creatinine stable on Biktarvy and will check today.   Routine screening for STI (sexually transmitted infection) Screening offered and declined today.    Orders Placed This Encounter  Procedures   CBC   COMPLETE METABOLIC PANEL WITH GFR   T-helper cell (CD4)- ([facility] clinic only)   HIV-1 RNA quant-no reflex-bld        Vedia Coffer for Infectious Disease [facility] 07/25/2022, 9:55 AM

## 2022-07-26 LAB — T-HELPER CELL (CD4) - (RCID CLINIC ONLY)
CD4 % Helper T Cell: 22 % — ABNORMAL LOW (ref 33–65)
CD4 T Cell Abs: 539 /uL (ref 400–1790)

## 2022-07-28 LAB — COMPLETE METABOLIC PANEL WITH GFR
AG Ratio: 1.6 (calc) (ref 1.0–2.5)
ALT: 11 U/L (ref 9–46)
Albumin: 4.5 g/dL (ref 3.6–5.1)
CO2: 28 mmol/L (ref 20–32)
Calcium: 9.2 mg/dL (ref 8.6–10.3)
Chloride: 104 mmol/L (ref 98–110)
Creat: 1.08 mg/dL (ref 0.60–1.26)
Glucose, Bld: 104 mg/dL — ABNORMAL HIGH (ref 65–99)
Potassium: 4 mmol/L (ref 3.5–5.3)
Sodium: 138 mmol/L (ref 135–146)

## 2022-07-28 LAB — CBC
HCT: 44.4 % (ref 38.5–50.0)
Hemoglobin: 14.4 g/dL (ref 13.2–17.1)
MCH: 27.4 pg (ref 27.0–33.0)
MCHC: 32.4 g/dL (ref 32.0–36.0)
MCV: 84.6 fL (ref 80.0–100.0)
MPV: 9.9 fL (ref 7.5–12.5)
Platelets: 244 10*3/uL (ref 140–400)
RBC: 5.25 10*6/uL (ref 4.20–5.80)
RDW: 13.4 % (ref 11.0–15.0)
WBC: 4.7 10*3/uL (ref 3.8–10.8)

## 2022-07-28 LAB — HIV-1 RNA QUANT-NO REFLEX-BLD
HIV 1 RNA Quant: NOT DETECTED Copies/mL
HIV-1 RNA Quant, Log: NOT DETECTED Log cps/mL

## 2022-07-29 ENCOUNTER — Telehealth: Payer: Self-pay

## 2022-07-29 NOTE — Telephone Encounter (Signed)
Patient aware of results. Patient scheduled for follow up with Dr.Comer on 10/8.

## 2022-10-10 NOTE — Telephone Encounter (Signed)
Patient provided with [facility] information via Mychart and message has been read.  Valarie Cones, LPN

## 2023-01-16 ENCOUNTER — Encounter: Payer: Self-pay | Admitting: Internal Medicine

## 2023-01-24 ENCOUNTER — Ambulatory Visit: Payer: Self-pay | Admitting: Internal Medicine

## 2023-02-16 ENCOUNTER — Ambulatory Visit: Payer: Self-pay | Admitting: Internal Medicine

## 2023-02-20 ENCOUNTER — Ambulatory Visit (INDEPENDENT_AMBULATORY_CARE_PROVIDER_SITE_OTHER): Payer: Self-pay | Admitting: Family

## 2023-02-20 ENCOUNTER — Other Ambulatory Visit: Payer: Self-pay

## 2023-02-20 ENCOUNTER — Encounter: Payer: Self-pay | Admitting: Family

## 2023-02-20 VITALS — BP 117/73 | HR 80 | Temp 98.3°F | Ht 68.0 in | Wt 186.0 lb

## 2023-02-20 DIAGNOSIS — Z0000 Encounter for general adult medical examination without abnormal findings: Secondary | ICD-10-CM | POA: Insufficient documentation

## 2023-02-20 DIAGNOSIS — Z23 Encounter for immunization: Secondary | ICD-10-CM

## 2023-02-20 DIAGNOSIS — Z113 Encounter for screening for infections with a predominantly sexual mode of transmission: Secondary | ICD-10-CM

## 2023-02-20 DIAGNOSIS — Z720 Tobacco use: Secondary | ICD-10-CM

## 2023-02-20 DIAGNOSIS — B20 Human immunodeficiency virus [HIV] disease: Secondary | ICD-10-CM

## 2023-02-20 MED ORDER — BIKTARVY 50-200-25 MG PO TABS: 1.0000 | ORAL_TABLET | Freq: Every day | ORAL | 6 refills | Status: DC

## 2023-02-20 NOTE — Assessment & Plan Note (Signed)
Mr. Mishkin continues to have well-controlled virus with good adherence and tolerance to [employer].  Reviewed previous lab work and discussed plan of care and U equals U.  Check blood work.  Financial assistance renewed.  Continue current dose of Biktarvy.  Plan for follow-up in 6 months or sooner if needed with lab work on the same day.

## 2023-02-20 NOTE — Patient Instructions (Addendum)
Nice to see you.  We will check your lab work today.  Continue to take your medication daily as prescribed.  Please call [facility]) to schedule/follow up on your dental care at [phone]  Refills have been sent to the pharmacy.  Plan for follow up with Tammy Sours or Dr. Luciana Axe in 6 months or sooner if needed with lab work on the same day.  Have a great day and stay safe!

## 2023-02-20 NOTE — Assessment & Plan Note (Signed)
Discussed importance of safe sexual practice and condom use. Condoms and STD testing offered.  Vaccinations reviewed - influenza and Covid updated Due for routine dental care with information provided to contact [facility] and make appointment.

## 2023-02-20 NOTE — Progress Notes (Signed)
Brief Narrative   Patient ID: Daniel [name], male    DOB: [birth year], 32 y.o.   MRN: [id number]  Daniel [name] is a 32 year old African-American male diagnosed with HIV disease in April 2022 with risk factor of MSM.  Initial viral load was 32 with CD4 count 338.  Entered care at [facility] stage II.  No genotype data available.  No history of opportunistic infection.  QuantiFERON gold negative.  ART experience with Biktarvy.  Subjective:    Chief Complaint  Patient presents with   Follow-up    B20    HPI:  Daniel [name] is a 32 y.o. male with HIV disease last seen by Dr. Earlene Plater on 07/25/2022 with well-controlled virus and good adherence and tolerance to Biktarvy.  Viral load was undetectable with CD4 count 539.  Kidney function, liver function, electrolytes within normal ranges.  Here today for routine follow-up.  Daniel [name] has been doing well since his last office visit and continues to take Biktarvy daily as prescribed with no adverse side effects.  No problems obtaining medication from the pharmacy.  Renewed financial assistance.  Continues to vape daily.  Condoms and STD testing offered.  Vaccinations reviewed and would like COVID and influenza vaccinations.  Due for routine dental care with previous referral placed to [facility].   Denies fevers, chills, night sweats, headaches, changes in vision, neck pain/stiffness, nausea, diarrhea, vomiting, lesions or rashes.  Lab Results  Component Value Date   CD4TCELL 22 (L) 07/25/2022   CD4TABS 539 07/25/2022   Lab Results  Component Value Date   HIV1RNAQUANT Not Detected 07/25/2022     Allergies  Allergen Reactions   Iodine Rash   Shellfish Allergy Rash      Outpatient Medications Prior to Visit  Medication Sig Dispense Refill   bictegravir-emtricitabine-tenofovir AF (BIKTARVY) 50-200-25 MG TABS tablet Take 1 tablet by mouth daily. 30 tablet 5   No facility-administered medications prior to visit.     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  Eyes:  Negative for visual disturbance.  Respiratory:  Negative for cough, chest tightness, shortness of breath and wheezing.   Cardiovascular:  Negative for chest pain and leg swelling.  Gastrointestinal:  Negative for abdominal pain, constipation, diarrhea, nausea and vomiting.  Genitourinary:  Negative for dysuria, flank pain, frequency, genital sores, hematuria and urgency.  Skin:  Negative for rash.  Allergic/Immunologic: Negative for immunocompromised state.  Neurological:  Negative for dizziness and headaches.      Objective:    BP 117/73   Pulse 80   Temp 98.3 F (36.8 C) (Temporal)   Ht 5\' 8"  (1.727 m)   Wt 186 lb (84.4 kg)   SpO2 96%   BMI 28.28 kg/m  Nursing note and vital signs reviewed.  Physical Exam Constitutional:      General: He is not in acute distress.    Appearance: He is well-developed.  Eyes:     Conjunctiva/sclera: Conjunctivae normal.  Cardiovascular:     Rate and Rhythm: Normal rate and regular rhythm.     Heart sounds: Normal heart sounds. No murmur heard.  No friction rub. No gallop.  Pulmonary:     Effort: Pulmonary effort is normal. No respiratory distress.     Breath sounds: Normal breath sounds. No wheezing or rales.  Chest:     Chest wall: No tenderness.  Abdominal:     General: Bowel sounds are normal.     Palpations: Abdomen is soft.     Tenderness: There is no abdominal tenderness.  Musculoskeletal:     Cervical back: Neck supple.  Lymphadenopathy:     Cervical: No cervical adenopathy.  Skin:    General: Skin is warm and dry.     Findings: No rash.  Neurological:      Mental Status: He is alert and oriented to person, place, and time.  Psychiatric:        Behavior: Behavior normal.        Thought Content: Thought content normal.        Judgment: Judgment normal.         02/20/2023    1:15 PM 07/25/2022    9:48 AM 12/29/2021    4:04 PM 06/25/2021    3:44 PM 01/06/2021   10:26 AM  Depression screen PHQ 2/9  Decreased Interest 0 0 0 0 0  Down, Depressed, Hopeless 0 0 0 0 0  PHQ - 2 Score 0 0 0 0 0       Assessment & Plan:    Patient Active Problem List   Diagnosis Date Noted   Healthcare maintenance 02/20/2023   Tobacco abuse 12/29/2021   Need for vaccination for bacterial disease 06/25/2021   Human immunodeficiency virus (HIV) disease (HCC) 08/21/2020   Encounter for long-term (current) use of high-risk medication 08/21/2020   Routine screening for STI (sexually transmitted infection) 08/21/2020     Problem List Items Addressed This Visit       Other   Human immunodeficiency virus (HIV) disease (HCC) - Primary    Daniel [name] continues to have well-controlled virus with good adherence and tolerance to [employer].  Reviewed previous lab work and discussed plan of care and U equals U.  Check blood work.  Financial assistance renewed.  Continue current dose of Biktarvy.  Plan for follow-up in 6 months or sooner if needed with lab work on the same day.      Relevant Medications   bictegravir-emtricitabine-tenofovir AF (BIKTARVY) 50-200-25 MG TABS tablet   Other Relevant Orders   COMPLETE METABOLIC PANEL WITH GFR   HIV-1 RNA quant-no reflex-bld   T-helper cell (CD4)- ([facility] clinic only)   Tobacco abuse    Daniel [name] continues to vape daily.  Discussed importance of tobacco cessation to reduce risk of development of disease and/or complications in the future.  He is not ready to quit at this time.      Healthcare maintenance    Discussed importance of safe sexual practice and condom use. Condoms and STD testing offered.  Vaccinations  reviewed - influenza and Covid updated Due for routine dental care with information provided to contact [facility] and make appointment.       Other Visit Diagnoses     Screening for STDs (sexually transmitted diseases)       Relevant Orders   RPR        I am having Daniel [name] maintain his Daniel [name].   Meds ordered this encounter  Medications   bictegravir-emtricitabine-tenofovir AF (BIKTARVY) 50-200-25 MG TABS tablet    Sig: Take 1 tablet by mouth daily.    Dispense:  30 tablet  Refill:  6    Order Specific Question:   Supervising Provider    Answer:   Judyann Munson [[id number]]     Follow-up: Return in about 6 months (around 08/20/2023), or if symptoms worsen or fail to improve. or sooner if needed.    Marcos Eke, MSN, FNP-C Nurse Practitioner [facility] Main number: [phone]

## 2023-02-20 NOTE — Assessment & Plan Note (Signed)
Daniel [name] continues to vape daily.  Discussed importance of tobacco cessation to reduce risk of development of disease and/or complications in the future.  He is not ready to quit at this time.

## 2023-02-21 LAB — T-HELPER CELL (CD4) - (RCID CLINIC ONLY)
CD4 % Helper T Cell: 40 % (ref 33–65)
CD4 T Cell Abs: 556 /uL (ref 400–1790)

## 2023-02-22 LAB — COMPLETE METABOLIC PANEL WITH GFR
AG Ratio: 1.6 (calc) (ref 1.0–2.5)
ALT: 7 U/L — ABNORMAL LOW (ref 9–46)
AST: 12 U/L (ref 10–40)
Albumin: 4.2 g/dL (ref 3.6–5.1)
Alkaline phosphatase (APISO): 37 U/L (ref 36–130)
BUN: 10 mg/dL (ref 7–25)
CO2: 27 mmol/L (ref 20–32)
Calcium: 9.5 mg/dL (ref 8.6–10.3)
Chloride: 107 mmol/L (ref 98–110)
Creat: 1.12 mg/dL (ref 0.60–1.26)
Globulin: 2.7 g/dL (ref 1.9–3.7)
Glucose, Bld: 106 mg/dL — ABNORMAL HIGH (ref 65–99)
Potassium: 4 mmol/L (ref 3.5–5.3)
Sodium: 142 mmol/L (ref 135–146)
Total Bilirubin: 0.3 mg/dL (ref 0.2–1.2)
Total Protein: 6.9 g/dL (ref 6.1–8.1)
eGFR: 89 mL/min/{1.73_m2} (ref >=60)

## 2023-02-22 LAB — RPR: RPR Ser Ql: NONREACTIVE

## 2023-02-22 LAB — HIV-1 RNA QUANT-NO REFLEX-BLD
HIV 1 RNA Quant: NOT DETECTED {copies}/mL
HIV-1 RNA Quant, Log: NOT DETECTED {Log_copies}/mL

## 2023-05-15 ENCOUNTER — Encounter (HOSPITAL_BASED_OUTPATIENT_CLINIC_OR_DEPARTMENT_OTHER): Payer: Self-pay | Admitting: Emergency Medicine

## 2023-05-15 ENCOUNTER — Emergency Department (HOSPITAL_BASED_OUTPATIENT_CLINIC_OR_DEPARTMENT_OTHER)
Admission: EM | Admit: 2023-05-15 | Discharge: 2023-05-15 | Disposition: A | Discharge status: Home / Self Care | Payer: Medicaid Other | Attending: Emergency Medicine | Admitting: Emergency Medicine

## 2023-05-15 ENCOUNTER — Other Ambulatory Visit: Payer: Self-pay

## 2023-05-15 DIAGNOSIS — Z5321 Procedure and treatment not carried out due to patient leaving prior to being seen by health care provider: Secondary | ICD-10-CM | POA: Insufficient documentation

## 2023-05-15 DIAGNOSIS — R6884 Jaw pain: Secondary | ICD-10-CM | POA: Insufficient documentation

## 2023-05-15 HISTORY — DX: Asymptomatic human immunodeficiency virus (hiv) infection status: Z21

## 2023-05-15 NOTE — ED Triage Notes (Signed)
PtPOV reports broken molar on top left- c/o swelling on L jaw x "months" chills, nausea x1 week. Denies fever.  Reports good po intake.   Taken advil appx 1000 today.

## 2023-08-28 ENCOUNTER — Ambulatory Visit: Payer: Self-pay | Admitting: Family

## 2023-08-28 ENCOUNTER — Telehealth: Payer: Self-pay

## 2023-08-28 NOTE — Telephone Encounter (Signed)
 Mychart msg sent

## 2023-09-20 ENCOUNTER — Ambulatory Visit: Admitting: Family

## 2023-10-17 NOTE — Progress Notes (Deleted)
 Brief Narrative   Patient ID: Daniel [name], male    DOB: May 14, 1990, 33 y.o.   MRN: [id number]  Daniel [name] is a 33 year old African-American male diagnosed with HIV disease in April 2022 with risk factor of MSM.  Initial viral load was 32 with CD4 count 338.  Entered care at [facility] stage II.  No genotype data available.  No history of opportunistic infection.  QuantiFERON gold negative.  ART experience with Biktarvy .   Subjective:   No chief complaint on file.   HPI:  Daniel [name] is a 33 y.o. male with HIV disease last seen on 03/11/24 with well controlled virus and good adherence and tolerance to Biktarvy . Viral load was undetectable and CD4 count 556. RPR was non-reactive. Renal function, hepatic function and electrolytes within normal ranges. Here today for routine follow up.     Denies fevers, chills, night sweats, headaches, changes in vision, neck pain/stiffness, nausea, diarrhea, vomiting, lesions or rashes.  Lab Results  Component Value Date   CD4TCELL 40 02/20/2023   CD4TABS 556 02/20/2023   Lab Results  Component Value Date   HIV1RNAQUANT Not Detected 02/20/2023     Allergies  Allergen Reactions   Iodine Rash   Shellfish Allergy Rash      Outpatient Medications Prior to Visit  Medication Sig Dispense Refill   bictegravir-emtricitabine-tenofovir AF (BIKTARVY ) 50-200-25 MG TABS tablet Take 1 tablet by mouth daily. 30 tablet 6   No facility-administered medications prior to visit.     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 Nursing note and vital signs reviewed.  Physical Exam        02/20/2023    1:15 PM 07/25/2022    9:48 AM 12/29/2021    4:04 PM 06/25/2021    3:44 PM 01/06/2021   10:26 AM  Depression screen PHQ 2/9  Decreased Interest 0 0 0 0 0  Down, Depressed, Hopeless 0 0 0 0 0  PHQ - 2 Score 0 0 0 0 0         No data to display           The ASCVD Risk score (Arnett DK, et al., 2019) failed to calculate for the following reasons:   The 2019 ASCVD risk score is only valid for ages 64 to 22      Assessment & Plan:    Patient Active Problem List   Diagnosis Date Noted   Healthcare maintenance 02/20/2023   Tobacco abuse 12/29/2021   Need for vaccination for bacterial disease 06/25/2021   Human immunodeficiency virus (HIV) disease (HCC) 08/21/2020   Encounter for long-term (current) use of high-risk medication 08/21/2020   Routine screening for STI (sexually transmitted infection) 08/21/2020     Problem List Items Addressed This Visit   None    I am having Selinda Miu maintain his Biktarvy .   No orders of the defined types were placed in this encounter.    Follow-up: No follow-ups on file. or sooner if needed.    Greg Everrett Lacasse, MSN, FNP-C Nurse  Practitioner [facility] Main number: [phone]

## 2023-10-18 ENCOUNTER — Ambulatory Visit: Admitting: Family

## 2023-10-27 NOTE — Telephone Encounter (Signed)
 Pts significant other called office today regarding concerns for missed doses. Is worried about resistance and vl increasing due to being off medication.  Patient reports he is back on medication today. Was off for six days due to forgetting to take it. Informed patient that we would have to wait until his appt for labs to check where is viral load stands.  Did request he try to refrain from missing doses if possible. Recommended he try setting alarm or try utilizing weekly pill try to avoid missing doses. No other questions at this time.  Lorenda CHRISTELLA Code, RMA

## 2023-10-30 ENCOUNTER — Other Ambulatory Visit: Payer: Self-pay | Admitting: Family

## 2023-10-30 NOTE — Telephone Encounter (Signed)
 Appt 11/02/23

## 2023-11-02 ENCOUNTER — Other Ambulatory Visit: Payer: Self-pay

## 2023-11-02 ENCOUNTER — Ambulatory Visit: Admitting: Family

## 2023-11-02 ENCOUNTER — Encounter: Payer: Self-pay | Admitting: Family

## 2023-11-02 VITALS — BP 114/76 | HR 75 | Temp 98.2°F | Ht 68.0 in | Wt 193.0 lb

## 2023-11-02 DIAGNOSIS — B20 Human immunodeficiency virus [HIV] disease: Secondary | ICD-10-CM | POA: Diagnosis present

## 2023-11-02 DIAGNOSIS — Z0000 Encounter for general adult medical examination without abnormal findings: Secondary | ICD-10-CM

## 2023-11-02 MED ORDER — BIKTARVY 50-200-25 MG PO TABS: 1.0000 | ORAL_TABLET | Freq: Every day | ORAL | 6 refills | Status: AC

## 2023-11-02 NOTE — Patient Instructions (Addendum)
 Nice to see you.  We will check your lab work today.  Continue to take your medication daily as prescribed.  Refills have been sent to the pharmacy.  Plan for follow up in 6 months or sooner if needed with lab work on the same day.  Have a great day and stay safe!   Smoking Cessation: Quitline[place] 1-800-QUIT-NOW (775)012-8931); Espaol: 1-855-Djelo-Ya (1-(905)002-5895) http://carroll-castaneda.info/

## 2023-11-02 NOTE — Assessment & Plan Note (Signed)
 Discussed importance of safe sexual practice and condom use. Condoms and site specific STD testing offered.  Vaccinations reviewed and up-to-date.  Will be due for Prevnar 20 and Menveo. Routine dental care up-to-date.

## 2023-11-02 NOTE — Progress Notes (Signed)
 Brief Narrative   Patient ID: Daniel [name], male    DOB: 1990-05-17, 33 y.o.   MRN: [id number]  Mr. Daniel [name] is a 33 year old African-American male diagnosed with HIV disease in April 2022 with risk factor of MSM.  Initial viral load was 32 with CD4 count 338.  Entered care at [facility] stage II.  No genotype data available.  No history of opportunistic infection.  QuantiFERON gold negative.  ART experience with Biktarvy .   Subjective:   Chief Complaint  Patient presents with   Follow-up    Needs Biktarvy  refills    HPI:  Daniel [name] is a 33 y.o. male with HIV disease last seen on 03/11/24 with well controlled virus and good adherence and tolerance to Biktarvy . Viral load was undetectable and CD4 count 556. RPR was non-reactive. Renal function, hepatic function and electrolytes within normal ranges. Here today for routine follow up.   [name] has been doing well since his last office visit he continues to take Biktarvy  as prescribed with no adverse side effects or problem obtaining medication from the pharmacy.  Covered by Medicaid.  Has questions about his nutritional intake.  No new concerns/complaints.  Housing, transportation, and access to food are stable.  Currently sexually active with condoms and site-specific STD testing offered.  Healthcare maintenance reviewed.  Denies fevers, chills, night sweats, headaches, changes in vision, neck pain/stiffness, nausea, diarrhea, vomiting, lesions or rashes.  Lab Results  Component Value Date   CD4TCELL 40 02/20/2023   CD4TABS 556 02/20/2023   Lab Results  Component Value Date   HIV1RNAQUANT Not Detected 02/20/2023     Allergies  Allergen Reactions   Iodine Rash   Shellfish Allergy Rash      Outpatient Medications Prior to Visit  Medication Sig Dispense Refill   bictegravir-emtricitabine-tenofovir AF (BIKTARVY ) 50-200-25 MG TABS tablet Take 1 tablet by mouth daily. 30 tablet 6   No facility-administered medications prior  to visit.     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  Eyes:  Negative for visual disturbance.  Respiratory:  Negative for cough, chest tightness, shortness of breath and wheezing.   Cardiovascular:  Negative for chest pain and leg swelling.  Gastrointestinal:  Negative for abdominal pain, constipation, diarrhea, nausea and vomiting.  Genitourinary:  Negative for dysuria, flank pain, frequency, genital sores, hematuria and urgency.  Skin:  Negative for rash.  Allergic/Immunologic: Negative for immunocompromised state.  Neurological:  Negative for dizziness and headaches.     Objective:   BP 114/76   Pulse 75   Temp 98.2 F (36.8 C) (Temporal)   Ht 5' 8 (1.727 m)   Wt 193 lb (87.5 kg)   SpO2 98%   BMI 29.35 kg/m  Nursing note and vital signs reviewed.  Physical Exam Constitutional:      General: He is not in acute distress.    Appearance: He is well-developed.  Eyes:     Conjunctiva/sclera: Conjunctivae normal.  Cardiovascular:     Rate and Rhythm: Normal rate and regular rhythm.  Heart sounds: Normal heart sounds. No murmur heard.    No friction rub. No gallop.  Pulmonary:     Effort: Pulmonary effort is normal. No respiratory distress.     Breath sounds: Normal breath sounds. No wheezing or rales.  Chest:     Chest wall: No tenderness.  Abdominal:     General: Bowel sounds are normal.     Palpations: Abdomen is soft.     Tenderness: There is no abdominal tenderness.  Musculoskeletal:     Cervical back: Neck supple.  Lymphadenopathy:     Cervical: No  cervical adenopathy.  Skin:    General: Skin is warm and dry.     Findings: No rash.  Neurological:     Mental Status: He is alert and oriented to person, place, and time.  Psychiatric:        Behavior: Behavior normal.        Thought Content: Thought content normal.        Judgment: Judgment normal.          11/02/2023    1:44 PM 02/20/2023    1:15 PM 07/25/2022    9:48 AM 12/29/2021    4:04 PM 06/25/2021    3:44 PM  Depression screen PHQ 2/9  Decreased Interest 0 0 0 0 0  Down, Depressed, Hopeless 0 0 0 0 0  PHQ - 2 Score 0 0 0 0 0  Altered sleeping 3      Tired, decreased energy --      Change in appetite 0      Feeling bad or failure about yourself  0      Trouble concentrating 0      Moving slowly or fidgety/restless 0      Suicidal thoughts 0      PHQ-9 Score 3      Difficult doing work/chores Not difficult at all            11/02/2023    1:44 PM  GAD 7 : Generalized Anxiety Score  Nervous, Anxious, on Edge 0  Control/stop worrying 0  Worry too much - different things 0  Trouble relaxing 0  Restless 0  Easily annoyed or irritable 0  Afraid - awful might happen 0  Total GAD 7 Score 0     The ASCVD Risk score (Arnett DK, et al., 2019) failed to calculate for the following reasons:   The 2019 ASCVD risk score is only valid for ages 63 to 35      Assessment & Plan:    Patient Active Problem List   Diagnosis Date Noted   Healthcare maintenance 02/20/2023   Tobacco abuse 12/29/2021   Need for vaccination for bacterial disease 06/25/2021   Human immunodeficiency virus (HIV) disease (HCC) 08/21/2020   Encounter for long-term (current) use of high-risk medication 08/21/2020   Routine screening for STI (sexually transmitted infection) 08/21/2020     Problem List Items Addressed This Visit       Other   Human immunodeficiency virus (HIV) disease (HCC) - Primary   [name] continues to have well-controlled virus with good adherence and tolerance to  Biktarvy .  Reviewed previous lab work and discussed plan of care and U equals U.  No problems obtaining medication from the pharmacy and covered by Medicaid.  Social determinants of health reviewed with no interventions indicated.  Check blood work.  Continue current dose of Biktarvy .  Plan for follow-up in 6 months or sooner if needed with lab  work on the same day.      Relevant Medications   bictegravir-emtricitabine-tenofovir AF (BIKTARVY ) 50-200-25 MG TABS tablet   Other Relevant Orders   Comprehensive metabolic panel with GFR   HIV-1 RNA quant-no reflex-bld   T-helper cell (CD4)- ([facility] clinic only)   Healthcare maintenance   Discussed importance of safe sexual practice and condom use. Condoms and site specific STD testing offered.  Vaccinations reviewed and up-to-date.  Will be due for Prevnar 20 and Menveo. Routine dental care up-to-date.        I am having Selinda Miu maintain his Biktarvy .   Meds ordered this encounter  Medications   bictegravir-emtricitabine-tenofovir AF (BIKTARVY ) 50-200-25 MG TABS tablet    Sig: Take 1 tablet by mouth daily.    Dispense:  30 tablet    Refill:  6    Supervising Provider:   LUIZ CHANNEL [phone]    Prescription Type::   Renewal     Follow-up: Return in about 6 months (around 05/04/2024). or sooner if needed.    Cathlyn July, MSN, FNP-C Nurse Practitioner [facility] Main number: [phone]

## 2023-11-02 NOTE — Assessment & Plan Note (Signed)
 Mr. Cawood continues to have well-controlled virus with good adherence and tolerance to Biktarvy .  Reviewed previous lab work and discussed plan of care and U equals U.  No problems obtaining medication from the pharmacy and covered by Medicaid.  Social determinants of health reviewed with no interventions indicated.  Check blood work.  Continue current dose of Biktarvy .  Plan for follow-up in 6 months or sooner if needed with lab work on the same day.

## 2023-11-02 NOTE — Progress Notes (Signed)
 Patient declined recommended vaccines and labs/STI screening today due to having to leave right after visit. Patient scheduled for labs next week along with nurse visit to update vaccination discussed at visit.   Daniel Kleine, LPN

## 2023-11-06 ENCOUNTER — Other Ambulatory Visit

## 2023-11-07 ENCOUNTER — Telehealth: Payer: Self-pay

## 2023-11-07 DIAGNOSIS — F319 Bipolar disorder, unspecified: Secondary | ICD-10-CM

## 2023-11-07 NOTE — Telephone Encounter (Signed)
 Patient's wife left voicemail requesting urgent referral to behavioral health.   Called Daniel [name] to get more details, his wife answered and conferenced Daniel [name] into the call. Daniel [name] provided verbal permission to speak with his wife.  They report concerns of manic depression and ADHD. Daniel [name] denies any thoughts of wanting to hurt himself or others.   Routing to provider to place referral.   Daniel [name], BSN, RN

## 2023-11-07 NOTE — Addendum Note (Signed)
 Addended by: Shannie Kontos D on: 11/07/2023 02:36 PM   Modules accepted: Orders

## 2023-11-07 NOTE — Telephone Encounter (Signed)
 Referral placed. Unclear as to when appointments available and would recommend [facility] if needed.

## 2023-11-09 ENCOUNTER — Other Ambulatory Visit

## 2023-11-09 ENCOUNTER — Ambulatory Visit

## 2023-11-13 ENCOUNTER — Other Ambulatory Visit

## 2023-11-13 ENCOUNTER — Other Ambulatory Visit: Payer: Self-pay

## 2023-11-13 ENCOUNTER — Ambulatory Visit

## 2023-11-13 DIAGNOSIS — Z23 Encounter for immunization: Secondary | ICD-10-CM

## 2023-11-13 DIAGNOSIS — B20 Human immunodeficiency virus [HIV] disease: Secondary | ICD-10-CM

## 2023-11-13 NOTE — Progress Notes (Addendum)
 Administered Pneumococcal 20 (PCV20) and Meningococcal vaccine per provider order.  Patient tolerated procedure well with no immediate adverse reactions noted. Provided Vaccine Information Statements (VIS) for both vaccines and reviewed with patient. Patient verbalized understanding.  Enis Kleine, LPN   Agree with vaccination.   Cathlyn July, NP 12/06/2023 12:26 PM

## 2023-11-13 NOTE — Patient Instructions (Signed)
 Please make a follow up appointment this upcoming fall for Flu and covid vaccinations.

## 2023-11-14 LAB — T-HELPER CELL (CD4) - (RCID CLINIC ONLY)
CD4 % Helper T Cell: 37 % (ref 33–65)
CD4 T Cell Abs: 509 /uL (ref 400–1790)

## 2023-11-15 LAB — COMPREHENSIVE METABOLIC PANEL WITH GFR
AG Ratio: 1.6 (calc) (ref 1.0–2.5)
ALT: 8 U/L — ABNORMAL LOW (ref 9–46)
AST: 12 U/L (ref 10–40)
Albumin: 4.3 g/dL (ref 3.6–5.1)
Alkaline phosphatase (APISO): 35 U/L — ABNORMAL LOW (ref 36–130)
BUN: 8 mg/dL (ref 7–25)
CO2: 27 mmol/L (ref 20–32)
Calcium: 9.3 mg/dL (ref 8.6–10.3)
Chloride: 105 mmol/L (ref 98–110)
Creat: 1.15 mg/dL (ref 0.60–1.26)
Globulin: 2.7 g/dL (ref 1.9–3.7)
Glucose, Bld: 116 mg/dL — ABNORMAL HIGH (ref 65–99)
Potassium: 4 mmol/L (ref 3.5–5.3)
Sodium: 140 mmol/L (ref 135–146)
Total Bilirubin: 0.3 mg/dL (ref 0.2–1.2)
Total Protein: 7 g/dL (ref 6.1–8.1)
eGFR: 86 mL/min/1.73m2 (ref >=60)

## 2023-11-15 LAB — HIV-1 RNA QUANT-NO REFLEX-BLD
HIV 1 RNA Quant: NOT DETECTED {copies}/mL
HIV-1 RNA Quant, Log: NOT DETECTED {Log_copies}/mL

## 2023-11-20 ENCOUNTER — Ambulatory Visit: Payer: Self-pay | Admitting: Family

## 2023-12-06 NOTE — Progress Notes (Signed)
 Added. Please let me know if you need anything else. Thanks.  Cathlyn

## 2024-04-22 ENCOUNTER — Other Ambulatory Visit: Payer: Self-pay

## 2024-04-22 DIAGNOSIS — B20 Human immunodeficiency virus [HIV] disease: Secondary | ICD-10-CM

## 2024-04-22 DIAGNOSIS — Z113 Encounter for screening for infections with a predominantly sexual mode of transmission: Secondary | ICD-10-CM

## 2024-04-22 DIAGNOSIS — Z79899 Other long term (current) drug therapy: Secondary | ICD-10-CM

## 2024-05-06 ENCOUNTER — Ambulatory Visit: Payer: Self-pay | Admitting: Family
